# Patient Record
Sex: Male | Born: 1935 | Race: White | Hispanic: No | Smoking: Never smoker
Health system: Southern US, Community
[De-identification: ages and names within clinical notes are randomized; demographics above are authoritative.]

## PROBLEM LIST (undated history)

## (undated) DIAGNOSIS — F0390 Unspecified dementia without behavioral disturbance: Secondary | ICD-10-CM

## (undated) DIAGNOSIS — N390 Urinary tract infection, site not specified: Secondary | ICD-10-CM

## (undated) HISTORY — PX: KNEE ARTHROPLASTY: SHX992

---

## 1997-10-16 ENCOUNTER — Inpatient Hospital Stay (HOSPITAL_COMMUNITY): Admission: EM | Admit: 1997-10-16 | Discharge: 1997-10-18 | Payer: Self-pay | Admitting: *Deleted

## 2016-08-27 ENCOUNTER — Inpatient Hospital Stay (HOSPITAL_BASED_OUTPATIENT_CLINIC_OR_DEPARTMENT_OTHER)
Admission: EM | Admit: 2016-08-27 | Discharge: 2016-09-01 | DRG: 871 | Disposition: A | Discharge status: Skilled Nursing Facility | Payer: Medicare Other | Attending: Family Medicine | Admitting: Family Medicine

## 2016-08-27 ENCOUNTER — Encounter (HOSPITAL_BASED_OUTPATIENT_CLINIC_OR_DEPARTMENT_OTHER): Payer: Self-pay | Admitting: Emergency Medicine

## 2016-08-27 ENCOUNTER — Emergency Department (HOSPITAL_BASED_OUTPATIENT_CLINIC_OR_DEPARTMENT_OTHER): Payer: Medicare Other

## 2016-08-27 DIAGNOSIS — F0391 Unspecified dementia with behavioral disturbance: Secondary | ICD-10-CM

## 2016-08-27 DIAGNOSIS — F0390 Unspecified dementia without behavioral disturbance: Secondary | ICD-10-CM | POA: Diagnosis present

## 2016-08-27 DIAGNOSIS — N390 Urinary tract infection, site not specified: Secondary | ICD-10-CM | POA: Diagnosis present

## 2016-08-27 DIAGNOSIS — E876 Hypokalemia: Secondary | ICD-10-CM | POA: Diagnosis not present

## 2016-08-27 DIAGNOSIS — R5381 Other malaise: Secondary | ICD-10-CM | POA: Diagnosis present

## 2016-08-27 DIAGNOSIS — Z96659 Presence of unspecified artificial knee joint: Secondary | ICD-10-CM | POA: Diagnosis present

## 2016-08-27 DIAGNOSIS — F03918 Unspecified dementia, unspecified severity, with other behavioral disturbance: Secondary | ICD-10-CM

## 2016-08-27 DIAGNOSIS — R531 Weakness: Secondary | ICD-10-CM | POA: Diagnosis not present

## 2016-08-27 DIAGNOSIS — D7589 Other specified diseases of blood and blood-forming organs: Secondary | ICD-10-CM | POA: Diagnosis present

## 2016-08-27 DIAGNOSIS — A419 Sepsis, unspecified organism: Principal | ICD-10-CM | POA: Diagnosis present

## 2016-08-27 DIAGNOSIS — R0902 Hypoxemia: Secondary | ICD-10-CM

## 2016-08-27 DIAGNOSIS — R509 Fever, unspecified: Secondary | ICD-10-CM

## 2016-08-27 DIAGNOSIS — J9601 Acute respiratory failure with hypoxia: Secondary | ICD-10-CM

## 2016-08-27 DIAGNOSIS — R6521 Severe sepsis with septic shock: Secondary | ICD-10-CM | POA: Diagnosis present

## 2016-08-27 DIAGNOSIS — Z79899 Other long term (current) drug therapy: Secondary | ICD-10-CM

## 2016-08-27 DIAGNOSIS — R6510 Systemic inflammatory response syndrome (SIRS) of non-infectious origin without acute organ dysfunction: Secondary | ICD-10-CM

## 2016-08-27 DIAGNOSIS — Z7982 Long term (current) use of aspirin: Secondary | ICD-10-CM

## 2016-08-27 DIAGNOSIS — R001 Bradycardia, unspecified: Secondary | ICD-10-CM | POA: Diagnosis present

## 2016-08-27 DIAGNOSIS — D696 Thrombocytopenia, unspecified: Secondary | ICD-10-CM | POA: Diagnosis present

## 2016-08-27 DIAGNOSIS — N179 Acute kidney failure, unspecified: Secondary | ICD-10-CM

## 2016-08-27 HISTORY — DX: Unspecified dementia, unspecified severity, without behavioral disturbance, psychotic disturbance, mood disturbance, and anxiety: F03.90

## 2016-08-27 HISTORY — DX: Urinary tract infection, site not specified: N39.0

## 2016-08-27 LAB — COMPREHENSIVE METABOLIC PANEL
ALBUMIN: 3.7 g/dL (ref 3.5–5.0)
ALT: 27 U/L (ref 17–63)
AST: 43 U/L — ABNORMAL HIGH (ref 15–41)
Alkaline Phosphatase: 56 U/L (ref 38–126)
Anion gap: 10 (ref 5–15)
BUN: 23 mg/dL — ABNORMAL HIGH (ref 6–20)
CHLORIDE: 106 mmol/L (ref 101–111)
CO2: 26 mmol/L (ref 22–32)
Calcium: 9.6 mg/dL (ref 8.9–10.3)
Creatinine, Ser: 1.38 mg/dL — ABNORMAL HIGH (ref 0.61–1.24)
GFR calc non Af Amer: 46 mL/min — ABNORMAL LOW (ref >60)
GFR, EST AFRICAN AMERICAN: 53 mL/min — AB (ref >60)
GLUCOSE: 128 mg/dL — AB (ref 65–99)
Potassium: 4.2 mmol/L (ref 3.5–5.1)
SODIUM: 142 mmol/L (ref 135–145)
Total Bilirubin: 0.7 mg/dL (ref 0.3–1.2)
Total Protein: 7 g/dL (ref 6.5–8.1)

## 2016-08-27 LAB — CBG MONITORING, ED: Glucose-Capillary: 114 mg/dL — ABNORMAL HIGH (ref 65–99)

## 2016-08-27 LAB — I-STAT CG4 LACTIC ACID, ED: Lactic Acid, Venous: 1.63 mmol/L (ref 0.5–1.9)

## 2016-08-27 MED ORDER — ACETAMINOPHEN 650 MG RE SUPP
650.0000 mg | Freq: Once | RECTAL | Status: AC
  Administered 2016-08-27: 650 mg via RECTAL
  Filled 2016-08-27: qty 1

## 2016-08-27 NOTE — ED Notes (Signed)
CBG is 114

## 2016-08-27 NOTE — ED Provider Notes (Addendum)
[facility]Y [facility] Provider Note: Walter Dell, MD, FACEP  CSN: [id number] MRN: [id number] ARRIVAL: 08/27/16 at 2221 ROOM: MH09/MH09  By signing my name below, I, Walter Williamson, attest that this documentation has been prepared under the direction and in the presence of Walter Libra, MD. Electronically signed, Walter Williamson, ED Scribe. 08/27/16. 11:34 PM.   CHIEF COMPLAINT  Weakness   HISTORY OF PRESENT ILLNESS  Walter Williamson is a 83 y.o. male with h/o UTI, transported via family from a care facility to the Emergency Department with concern for progressive weakness onset today. Daughter informed today of tremors, chills, fatigue and low SPO2 readings this morning. She states she brought the pt to [facility] ED after she was informed that the pt was too weak to support his own weight and he had increased confusion and lethargy. He was noted to be febrile to 102 rectally in the ED.  11:29 PM nurse has started sepsis protocol; pt given 650 mg tylenol rectally for the fever.   LEVEL 5 CAVEAT: HPI and ROS limited due to altered mental status and dementia.   Past Medical History:  Diagnosis Date  . Dementia   . UTI (urinary tract infection)     Past Surgical History:  Procedure Laterality Date  . KNEE ARTHROPLASTY      Family History  Problem Relation Age of Onset  . Dementia Mother     Social History  Substance Use Topics  . Smoking status: Not on file  . Smokeless tobacco: Not on file  . Alcohol use Not on file    Prior to Admission medications   Medication Sig Start Date End Date Taking? Authorizing Provider  ALPRAZolam Prudy Feeler) 0.5 MG tablet Take 0.5 mg by mouth 3 (three) times daily as needed for anxiety or agitation. 07/28/16  Yes Historical Provider, MD  aspirin 81 MG chewable tablet Chew 81 mg by mouth daily.   Yes Historical Provider, MD  divalproex (DEPAKOTE SPRINKLE) 125 MG capsule Take 125 mg by mouth 2 (two) times daily. 08/11/16  Yes Historical Provider, MD    donepezil (ARICEPT) 10 MG tablet Take 20 mg by mouth every morning. 08/23/16  Yes Historical Provider, MD  finasteride (PROSCAR) 5 MG tablet Take 5 mg by mouth every morning. 08/12/16  Yes Historical Provider, MD  memantine (NAMENDA) 10 MG tablet Take 10 mg by mouth 2 (two) times daily. 08/17/16  Yes Historical Provider, MD  metoprolol tartrate (LOPRESSOR) 25 MG tablet Take 12.5 mg by mouth 2 (two) times daily. 08/17/16  Yes Historical Provider, MD  Multiple Vitamins-Minerals (CENTRUM SILVER 50+MEN PO) Take 1 tablet by mouth daily.   Yes Historical Provider, MD  pantoprazole (PROTONIX) 40 MG tablet Take 40 mg by mouth every morning. 07/28/16  Yes Historical Provider, MD  QUEtiapine (SEROQUEL) 25 MG tablet Take 25 mg by mouth at bedtime. 08/19/16  Yes Historical Provider, MD  simvastatin (ZOCOR) 40 MG tablet Take 40 mg by mouth every morning. 08/23/16  Yes Historical Provider, MD    Allergies Patient has no known allergies.   REVIEW OF SYSTEMS     PHYSICAL EXAMINATION  Initial Vital Signs Blood pressure 107/67, pulse 81, temperature (!) 102 F (38.9 C), temperature source Rectal, resp. rate 20, weight 181 lb (82.1 kg), SpO2 (!) 87 %.  Examination General: Well-developed, well-nourished male in no acute distress; appearance consistent with age of record HENT: normocephalic; atraumatic Eyes: pupils equal, round and reactive to light; Neck: supple Heart: regular rate and rhythm; bradychardia Lungs: distant  sounds Abdomen: soft; nondistended; nontender; no masses or hepatosplenomegaly; bowel sounds present Extremities: No deformity; full range of motion; pulses normal Neurologic: Awake, mildly lethargic; motor function intact and symmetric in all extremities; no facial droop Skin: Warm and dry   RESULTS  Summary of this visit's results, reviewed by myself:   EKG Interpretation  Date/Time:  Saturday August 27 2016 22:52:51 EDT Ventricular Rate:  55 PR Interval:    QRS Duration: 161 QT  Interval:  458 QTC Calculation: 439 R Axis:   -27 Text Interpretation:  Sinus bradycardia Left bundle branch block Confirmed by Kasmira Cacioppo  MD, Jonny Ruiz ([id number]) on 08/28/2016 12:14:14 AM      Laboratory Studies: Results for orders placed or performed during the hospital encounter of 08/27/16 (from the past 24 hour(s))  CBG monitoring, ED     Status: Abnormal   Collection Time: 08/27/16 11:12 PM  Result Value Ref Range   Glucose-Capillary 114 (H) 65 - 99 mg/dL  Comprehensive metabolic panel     Status: Abnormal   Collection Time: 08/27/16 11:21 PM  Result Value Ref Range   Sodium 142 135 - 145 mmol/L   Potassium 4.2 3.5 - 5.1 mmol/L   Chloride 106 101 - 111 mmol/L   CO2 26 22 - 32 mmol/L   Glucose, Bld 128 (H) 65 - 99 mg/dL   BUN 23 (H) 6 - 20 mg/dL   Creatinine, Ser 4.78 (H) 0.61 - 1.24 mg/dL   Calcium 9.6 8.9 - 29.5 mg/dL   Total Protein 7.0 6.5 - 8.1 g/dL   Albumin 3.7 3.5 - 5.0 g/dL   AST 43 (H) 15 - 41 U/L   ALT 27 17 - 63 U/L   Alkaline Phosphatase 56 38 - 126 U/L   Total Bilirubin 0.7 0.3 - 1.2 mg/dL   GFR calc non Af Amer 46 (L) >60 mL/min   GFR calc Af Amer 53 (L) >60 mL/min   Anion gap 10 5 - 15  CBC with Differential     Status: Abnormal   Collection Time: 08/27/16 11:21 PM  Result Value Ref Range   WBC 5.7 4.0 - 10.5 K/uL   RBC 4.04 (L) 4.22 - 5.81 MIL/uL   Hemoglobin 13.8 13.0 - 17.0 g/dL   HCT 62.1 30.8 - 65.7 %   MCV 102.5 (H) 78.0 - 100.0 fL   MCH 34.2 (H) 26.0 - 34.0 pg   MCHC 33.3 30.0 - 36.0 g/dL   RDW 84.6 96.2 - 95.2 %   Platelets 128 (L) 150 - 400 K/uL   Neutrophils Relative % 71 %   Lymphocytes Relative 18 %   Monocytes Relative 11 %   Eosinophils Relative 0 %   Basophils Relative 0 %   Neutro Abs 4.1 1.7 - 7.7 K/uL   Lymphs Abs 1.0 0.7 - 4.0 K/uL   Monocytes Absolute 0.6 0.1 - 1.0 K/uL   Eosinophils Absolute 0.0 0.0 - 0.7 K/uL   Basophils Absolute 0.0 0.0 - 0.1 K/uL   RBC Morphology ELLIPTOCYTES    WBC Morphology ATYPICAL LYMPHOCYTES    Smear  Review PLATELET COUNT CONFIRMED BY SMEAR   I-Stat CG4 Lactic Acid, ED     Status: None   Collection Time: 08/27/16 11:35 PM  Result Value Ref Range   Lactic Acid, Venous 1.63 0.5 - 1.9 mmol/L  Urinalysis, Routine w reflex microscopic     Status: Abnormal   Collection Time: 08/28/16 12:47 AM  Result Value Ref Range   Color, Urine YELLOW YELLOW  APPearance CLOUDY (A) CLEAR   Specific Gravity, Urine 1.016 1.005 - 1.030   pH 6.0 5.0 - 8.0   Glucose, UA NEGATIVE NEGATIVE mg/dL   Hgb urine dipstick MODERATE (A) NEGATIVE   Bilirubin Urine NEGATIVE NEGATIVE   Ketones, ur NEGATIVE NEGATIVE mg/dL   Protein, ur NEGATIVE NEGATIVE mg/dL   Nitrite NEGATIVE NEGATIVE   Leukocytes, UA LARGE (A) NEGATIVE  Urinalysis, Microscopic (reflex)     Status: Abnormal   Collection Time: 08/28/16 12:47 AM  Result Value Ref Range   RBC / HPF 0-5 0 - 5 RBC/hpf   WBC, UA 6-30 0 - 5 WBC/hpf   Bacteria, UA MANY (A) NONE SEEN   Squamous Epithelial / LPF 0-5 (A) NONE SEEN  Protime-INR     Status: None   Collection Time: 08/28/16  1:19 AM  Result Value Ref Range   Prothrombin Time 14.8 11.4 - 15.2 seconds   INR 1.15   Influenza panel by PCR (type A & B)     Status: None   Collection Time: 08/28/16  2:20 AM  Result Value Ref Range   Influenza A By PCR NEGATIVE NEGATIVE   Influenza B By PCR NEGATIVE NEGATIVE  I-Stat CG4 Lactic Acid, ED     Status: None   Collection Time: 08/28/16  4:08 AM  Result Value Ref Range   Lactic Acid, Venous 1.01 0.5 - 1.9 mmol/L  MRSA PCR Screening     Status: None   Collection Time: 08/28/16  7:56 AM  Result Value Ref Range   MRSA by PCR NEGATIVE NEGATIVE  CBC     Status: Abnormal   Collection Time: 08/28/16  8:15 AM  Result Value Ref Range   WBC 5.7 4.0 - 10.5 K/uL   RBC 3.93 (L) 4.22 - 5.81 MIL/uL   Hemoglobin 12.7 (L) 13.0 - 17.0 g/dL   HCT 53.6 64.4 - 03.4 %   MCV 105.3 (H) 78.0 - 100.0 fL   MCH 32.3 26.0 - 34.0 pg   MCHC 30.7 30.0 - 36.0 g/dL   RDW 74.2 59.5 - 63.8 %     Platelets 101 (L) 150 - 400 K/uL  Creatinine, serum     Status: None   Collection Time: 08/28/16  8:15 AM  Result Value Ref Range   Creatinine, Ser 1.02 0.61 - 1.24 mg/dL   GFR calc non Af Amer >60 >60 mL/min   GFR calc Af Amer >60 >60 mL/min   Imaging Studies: Dg Chest 2 View  Result Date: 08/28/2016 CLINICAL DATA:  Possible sepsis, fever with hypotension EXAM: CHEST  2 VIEW COMPARISON:  None. FINDINGS: Minimal atelectasis at the left lung base. No focal consolidation or large pleural effusion. Heart size upper normal. Aortic atherosclerosis. No pneumothorax. Mild degenerative changes of the spine. IMPRESSION: Minimal atelectasis at the left lung base.  No focal consolidation. Electronically Signed   By: Jasmine Pang M.D.   On: 08/28/2016 00:22    ED COURSE  Nursing notes and initial vitals signs, including pulse oximetry, reviewed.  Vitals:   08/28/16 0728 08/28/16 1124 08/28/16 1923 08/28/16 1958  BP: 117/62 (!) 122/93 107/60 (!) 96/56  Pulse: (!) 50 64 61 (!) 55  Resp: (!) 21 20 (!) 21 (!) 21  Temp: 97.4 F (36.3 C)   98.5 F (36.9 C)  TempSrc: Oral Oral    SpO2: 95%  (!) 85% 95%  Weight:      Height:       11:58 PM IV fluid bolus initiated  for borderline low blood pressure. Mental status has improved.  1:29 AM Urinalysis suggestive of urinary tract infection. Atypical lymphocytes could represent viral illness, including influenza. We'll test for influenza and have him admitted. We'll start Rocephin for possible UTI.  4:59 AM Blood pressures have continued to run somewhat low although both lactic acids have been within normal limits and the second one showed improvement. Patient awaiting transport to [facility].  PROCEDURES   CRITICAL CARE Performed by: Hanley Seamen Total critical care time: 35 minutes Critical care time was exclusive of separately billable procedures and treating other patients. Critical care was necessary to treat or prevent imminent  or life-threatening deterioration. Critical care was time spent personally by me on the following activities: development of treatment plan with patient and/or surrogate as well as nursing, discussions with consultants, evaluation of patient's response to treatment, examination of patient, obtaining history from patient or surrogate, ordering and performing treatments and interventions, ordering and review of laboratory studies, ordering and review of radiographic studies, pulse oximetry and re-evaluation of patient's condition.   ED DIAGNOSES     ICD-9-CM ICD-10-CM   1. SIRS (systemic inflammatory response syndrome) (HCC) 995.90 R65.10   2. Febrile illness 780.60 R50.9   3. Hypoxia 799.02 R09.02     I personally performed the services described in this documentation, which was scribed in my presence. The recorded information has been reviewed and is accurate.    Walter Libra, MD 08/28/16 0136    Walter Libra, MD 08/28/16 4098    Walter Libra, MD 08/28/16 2243

## 2016-08-27 NOTE — ED Notes (Signed)
[facility])  notified of Code Sepsis per Dr. Read Drivers' verbal orders

## 2016-08-27 NOTE — ED Triage Notes (Signed)
PT presents to ED with complaints of chills and weakness .. daughter states he has seemed weak for about a week now but worse today.  Daughter states he has a hisotry of bad UTI's

## 2016-08-27 NOTE — ED Notes (Signed)
EDP notified of pt meeting sepsis criteria. Protocol orders already in place.

## 2016-08-28 ENCOUNTER — Encounter (HOSPITAL_BASED_OUTPATIENT_CLINIC_OR_DEPARTMENT_OTHER): Payer: Self-pay | Admitting: Emergency Medicine

## 2016-08-28 DIAGNOSIS — N390 Urinary tract infection, site not specified: Secondary | ICD-10-CM | POA: Diagnosis present

## 2016-08-28 DIAGNOSIS — R0902 Hypoxemia: Secondary | ICD-10-CM | POA: Diagnosis not present

## 2016-08-28 DIAGNOSIS — A419 Sepsis, unspecified organism: Secondary | ICD-10-CM | POA: Diagnosis present

## 2016-08-28 DIAGNOSIS — F03918 Unspecified dementia, unspecified severity, with other behavioral disturbance: Secondary | ICD-10-CM

## 2016-08-28 DIAGNOSIS — R6521 Severe sepsis with septic shock: Secondary | ICD-10-CM | POA: Diagnosis present

## 2016-08-28 DIAGNOSIS — Z96659 Presence of unspecified artificial knee joint: Secondary | ICD-10-CM | POA: Diagnosis present

## 2016-08-28 DIAGNOSIS — F0390 Unspecified dementia without behavioral disturbance: Secondary | ICD-10-CM | POA: Diagnosis present

## 2016-08-28 DIAGNOSIS — N3000 Acute cystitis without hematuria: Secondary | ICD-10-CM | POA: Diagnosis not present

## 2016-08-28 DIAGNOSIS — R001 Bradycardia, unspecified: Secondary | ICD-10-CM | POA: Diagnosis present

## 2016-08-28 DIAGNOSIS — Z7982 Long term (current) use of aspirin: Secondary | ICD-10-CM | POA: Diagnosis not present

## 2016-08-28 DIAGNOSIS — J9601 Acute respiratory failure with hypoxia: Secondary | ICD-10-CM | POA: Diagnosis present

## 2016-08-28 DIAGNOSIS — D7589 Other specified diseases of blood and blood-forming organs: Secondary | ICD-10-CM | POA: Diagnosis present

## 2016-08-28 DIAGNOSIS — N179 Acute kidney failure, unspecified: Secondary | ICD-10-CM

## 2016-08-28 DIAGNOSIS — R531 Weakness: Secondary | ICD-10-CM | POA: Diagnosis present

## 2016-08-28 DIAGNOSIS — E876 Hypokalemia: Secondary | ICD-10-CM | POA: Diagnosis not present

## 2016-08-28 DIAGNOSIS — Z79899 Other long term (current) drug therapy: Secondary | ICD-10-CM | POA: Diagnosis not present

## 2016-08-28 DIAGNOSIS — D696 Thrombocytopenia, unspecified: Secondary | ICD-10-CM | POA: Diagnosis present

## 2016-08-28 DIAGNOSIS — R5381 Other malaise: Secondary | ICD-10-CM | POA: Diagnosis present

## 2016-08-28 DIAGNOSIS — F0391 Unspecified dementia with behavioral disturbance: Secondary | ICD-10-CM

## 2016-08-28 LAB — CBC
HCT: 41.4 % (ref 39.0–52.0)
HEMOGLOBIN: 12.7 g/dL — AB (ref 13.0–17.0)
MCH: 32.3 pg (ref 26.0–34.0)
MCHC: 30.7 g/dL (ref 30.0–36.0)
MCV: 105.3 fL — ABNORMAL HIGH (ref 78.0–100.0)
Platelets: 101 10*3/uL — ABNORMAL LOW (ref 150–400)
RBC: 3.93 MIL/uL — AB (ref 4.22–5.81)
RDW: 13.8 % (ref 11.5–15.5)
WBC: 5.7 10*3/uL (ref 4.0–10.5)

## 2016-08-28 LAB — URINALYSIS, ROUTINE W REFLEX MICROSCOPIC
BILIRUBIN URINE: NEGATIVE
Glucose, UA: NEGATIVE mg/dL
Ketones, ur: NEGATIVE mg/dL
NITRITE: NEGATIVE
PROTEIN: NEGATIVE mg/dL
Specific Gravity, Urine: 1.016 (ref 1.005–1.030)
pH: 6 (ref 5.0–8.0)

## 2016-08-28 LAB — PROTIME-INR
INR: 1.15
Prothrombin Time: 14.8 seconds (ref 11.4–15.2)

## 2016-08-28 LAB — CBC WITH DIFFERENTIAL/PLATELET
Basophils Absolute: 0 10*3/uL (ref 0.0–0.1)
Basophils Relative: 0 %
EOS PCT: 0 %
Eosinophils Absolute: 0 10*3/uL (ref 0.0–0.7)
HEMATOCRIT: 41.4 % (ref 39.0–52.0)
Hemoglobin: 13.8 g/dL (ref 13.0–17.0)
Lymphocytes Relative: 18 %
Lymphs Abs: 1 10*3/uL (ref 0.7–4.0)
MCH: 34.2 pg — AB (ref 26.0–34.0)
MCHC: 33.3 g/dL (ref 30.0–36.0)
MCV: 102.5 fL — AB (ref 78.0–100.0)
MONOS PCT: 11 %
Monocytes Absolute: 0.6 10*3/uL (ref 0.1–1.0)
NEUTROS PCT: 71 %
Neutro Abs: 4.1 10*3/uL (ref 1.7–7.7)
Platelets: 128 10*3/uL — ABNORMAL LOW (ref 150–400)
RBC: 4.04 MIL/uL — AB (ref 4.22–5.81)
RDW: 13.4 % (ref 11.5–15.5)
WBC: 5.7 10*3/uL (ref 4.0–10.5)

## 2016-08-28 LAB — INFLUENZA PANEL BY PCR (TYPE A & B)
Influenza A By PCR: NEGATIVE
Influenza B By PCR: NEGATIVE

## 2016-08-28 LAB — CREATININE, SERUM
CREATININE: 1.02 mg/dL (ref 0.61–1.24)
GFR calc Af Amer: >60 mL/min (ref >60)
GFR calc non Af Amer: >60 mL/min (ref >60)

## 2016-08-28 LAB — URINALYSIS, MICROSCOPIC (REFLEX)

## 2016-08-28 LAB — MRSA PCR SCREENING: MRSA BY PCR: NEGATIVE

## 2016-08-28 LAB — I-STAT CG4 LACTIC ACID, ED: LACTIC ACID, VENOUS: 1.01 mmol/L (ref 0.5–1.9)

## 2016-08-28 MED ORDER — FINASTERIDE 5 MG PO TABS
5.0000 mg | ORAL_TABLET | ORAL | Status: DC
  Administered 2016-08-29 – 2016-09-01 (×4): 5 mg via ORAL
  Filled 2016-08-28 (×4): qty 1

## 2016-08-28 MED ORDER — HEPARIN SODIUM (PORCINE) 5000 UNIT/ML IJ SOLN
5000.0000 [IU] | Freq: Three times a day (TID) | INTRAMUSCULAR | Status: DC
  Administered 2016-08-28 (×2): 5000 [IU] via SUBCUTANEOUS
  Filled 2016-08-28 (×2): qty 1

## 2016-08-28 MED ORDER — DONEPEZIL HCL 10 MG PO TABS
20.0000 mg | ORAL_TABLET | ORAL | Status: DC
  Administered 2016-08-29 – 2016-09-01 (×4): 20 mg via ORAL
  Filled 2016-08-28 (×4): qty 2

## 2016-08-28 MED ORDER — DIVALPROEX SODIUM 125 MG PO CSDR
125.0000 mg | DELAYED_RELEASE_CAPSULE | Freq: Two times a day (BID) | ORAL | Status: DC
  Administered 2016-08-28 – 2016-09-01 (×9): 125 mg via ORAL
  Filled 2016-08-28 (×10): qty 1

## 2016-08-28 MED ORDER — SODIUM CHLORIDE 0.9 % IV SOLN
INTRAVENOUS | Status: DC
Start: 2016-08-28 — End: 2016-08-28

## 2016-08-28 MED ORDER — SODIUM CHLORIDE 0.9 % IV SOLN
INTRAVENOUS | Status: DC
  Administered 2016-08-28 – 2016-08-29 (×3): via INTRAVENOUS
  Administered 2016-08-29: 100 mL/h via INTRAVENOUS

## 2016-08-28 MED ORDER — SODIUM CHLORIDE 0.9 % IV BOLUS (SEPSIS)
1000.0000 mL | Freq: Once | INTRAVENOUS | Status: AC
  Administered 2016-08-28: 1000 mL via INTRAVENOUS

## 2016-08-28 MED ORDER — TRAZODONE HCL 50 MG PO TABS
25.0000 mg | ORAL_TABLET | Freq: Every evening | ORAL | Status: DC | PRN
  Administered 2016-08-29 – 2016-08-30 (×2): 25 mg via ORAL
  Filled 2016-08-28 (×2): qty 1

## 2016-08-28 MED ORDER — MEMANTINE HCL 5 MG PO TABS
10.0000 mg | ORAL_TABLET | Freq: Two times a day (BID) | ORAL | Status: DC
  Administered 2016-08-28 – 2016-09-01 (×9): 10 mg via ORAL
  Filled 2016-08-28 (×11): qty 2

## 2016-08-28 MED ORDER — SODIUM CHLORIDE 0.9 % IV BOLUS (SEPSIS): 500.0000 mL | Freq: Once | INTRAVENOUS | Status: DC

## 2016-08-28 MED ORDER — ALPRAZOLAM 0.5 MG PO TABS: 0.5000 mg | ORAL_TABLET | Freq: Three times a day (TID) | ORAL | Status: DC | PRN

## 2016-08-28 MED ORDER — ASPIRIN 81 MG PO CHEW
81.0000 mg | CHEWABLE_TABLET | Freq: Every day | ORAL | Status: DC
  Administered 2016-08-28 – 2016-09-01 (×5): 81 mg via ORAL
  Filled 2016-08-28 (×5): qty 1

## 2016-08-28 MED ORDER — QUETIAPINE FUMARATE 25 MG PO TABS
25.0000 mg | ORAL_TABLET | Freq: Every day | ORAL | Status: DC
  Administered 2016-08-28 – 2016-08-31 (×4): 25 mg via ORAL
  Filled 2016-08-28 (×4): qty 1

## 2016-08-28 MED ORDER — ACETAMINOPHEN 325 MG PO TABS: 650.0000 mg | ORAL_TABLET | Freq: Four times a day (QID) | ORAL | Status: DC | PRN

## 2016-08-28 MED ORDER — BISACODYL 5 MG PO TBEC: 10.0000 mg | DELAYED_RELEASE_TABLET | Freq: Every day | ORAL | Status: DC | PRN

## 2016-08-28 MED ORDER — ORAL CARE MOUTH RINSE
15.0000 mL | Freq: Two times a day (BID) | OROMUCOSAL | Status: DC
  Administered 2016-08-28 – 2016-08-31 (×3): 15 mL via OROMUCOSAL

## 2016-08-28 MED ORDER — SIMVASTATIN 40 MG PO TABS
40.0000 mg | ORAL_TABLET | ORAL | Status: DC
  Administered 2016-08-29 – 2016-09-01 (×4): 40 mg via ORAL
  Filled 2016-08-28 (×4): qty 1

## 2016-08-28 MED ORDER — METOPROLOL TARTRATE 12.5 MG HALF TABLET
12.5000 mg | ORAL_TABLET | Freq: Two times a day (BID) | ORAL | Status: DC
  Administered 2016-08-28 – 2016-08-30 (×5): 12.5 mg via ORAL
  Filled 2016-08-28 (×5): qty 1

## 2016-08-28 MED ORDER — PANTOPRAZOLE SODIUM 40 MG PO TBEC
40.0000 mg | DELAYED_RELEASE_TABLET | ORAL | Status: DC
  Administered 2016-08-29 – 2016-09-01 (×4): 40 mg via ORAL
  Filled 2016-08-28 (×4): qty 1

## 2016-08-28 MED ORDER — ACETAMINOPHEN 650 MG RE SUPP: 650.0000 mg | Freq: Four times a day (QID) | RECTAL | Status: DC | PRN

## 2016-08-28 MED ORDER — DEXTROSE 5 % IV SOLN
1.0000 g | Freq: Once | INTRAVENOUS | Status: AC
  Administered 2016-08-28: 1 g via INTRAVENOUS
  Filled 2016-08-28: qty 10

## 2016-08-28 NOTE — Progress Notes (Addendum)
Pt right wrist iv infiltrated, pt denies any pain, IV taken out. Right arm elevated warm compresses applied. Dr. Adrian Blackwater paged. Will cont to monitor pt.

## 2016-08-28 NOTE — ED Notes (Signed)
Attempted to call report. No answer.

## 2016-08-28 NOTE — Progress Notes (Signed)
Pt's o2 sat went down to 85-90% on Room air. No labored breathing noted. Pt denied any SOB.  Pt placed on 4L of 02 and 02 sats went up to in to the mid 90's. Will cont to monitor pt.

## 2016-08-28 NOTE — Progress Notes (Signed)
Patient arrived from [facility], denies pain.  Heart rate noted in the 40-50s.  Patient asymptomatic.  [facility] pager paged with information in this note.

## 2016-08-28 NOTE — ED Notes (Signed)
IVF stopped due to pt having increased coughing, and development of rales. EDP notified and at bedside.

## 2016-08-28 NOTE — Progress Notes (Signed)
This is a no charge note  Transfer from [facility] per Dr. Read Drivers  83 year old man with past medical history dementia, UTI, who presents with fever, chills, altered mental status for about 1 week. Patient was found to have UTI with large amount of leukocytes on urinalysis. WBC 5.7, lactate 1.63, temperature 102, bradycardia, with saturation 87-93%. chest x-ray negative. Patient received 2 L normal saline bolus, will continue 125 cc/h. His initial blood pressure is 85/50, which improved to 91/85 after normal saline bolus. Patient is accepted to stepdown as inpatient. Patient was started with Rocephin.  Lorretta Harp, MD  [facility] Pager [phone]  If 7PM-7AM, please contact night-coverage [email or web address] Password [facility] 08/28/2016, 3:33 AM

## 2016-08-28 NOTE — ED Notes (Signed)
Attempted report x 1. RN busy, left call back number.  

## 2016-08-28 NOTE — ED Notes (Signed)
Oxygen saturation dropped to 88% on 3L. Oxygen increased to 4L, sats 92%.

## 2016-08-28 NOTE — H&P (Signed)
History and Physical    Walter Williamson WUJ:[id number] DOB: 29-Sep-1931 DOA: 08/27/2016  PCP: Malka So., MD   Patient coming from: the care facility  Chief Complaint: generalized weakness   HPI: Walter Williamson is a 83 y.o. male with medical history significant of dementia, UTI who presented to the ED [facility] with c/o progressive weakness. The patient was ihaving weakness for nearly a week , but yesterday morning he became progressively weak. The facility informed his daughter that the patient was getting weaker and unable to support himself, started having shaking, chills, with increasing oxygen requirements and SpO2 was dropping.  The facility also noted that the patient became confused and more lethargic than usual. The family was concerned and transported the patient to the ED [facility], but eventually he was transferred to the [facility] d/t not step-down bed availability in [facility]  ED Course: On the arrival he was febrile with temperature 102F, bradycardic with HR 42, hypotensive with BP 76/53 and SpO2 86% Blood work demonstrated normal Lactic acid 1.63-->1.01, creatinine 1.38 and thereis no creatinine available for comparison Chest xray showed minimal atelectasis in the LLL EKG showed sinus bradycardia with LBBB UA was suspicious for UTI  Review of Systems: As per HPI otherwise 10 point review of systems negative.   Ambulatory Status: unknown, but seems tht patient is able to ambulate independently  Past Medical History:  Diagnosis Date  . Dementia   . UTI (urinary tract infection)     Past Surgical History:  Procedure Laterality Date  . KNEE ARTHROPLASTY      Social History   Social History  . Marital status: Widowed    Spouse name: N/A  . Number of children: N/A  . Years of education: N/A   Occupational History  . Not on file.   Social History Main Topics  . Smoking status: Not on file  . Smokeless tobacco: Not on file  . Alcohol use Not on file  . Drug use: Unknown  . Sexual  activity: Not on file   Other Topics Concern  . Not on file   Social History Narrative  . No narrative on file   Patient denied using tobacco or alcohol No Known Allergies  Family History  Problem Relation Age of Onset  . Dementia Mother     Prior to Admission medications   Not on File    Physical Exam: Vitals:   08/28/16 0610 08/28/16 0629 08/28/16 0651 08/28/16 0728  BP: (!) 120/56   117/62  Pulse:    (!) 50  Resp:    (!) 21  Temp:  97.8 F (36.6 C)  97.4 F (36.3 C)  TempSrc:  Axillary  Oral  SpO2:  96%  95%  Weight:      Height:    (1.651 m)      General: Appears calm and comfortable Eyes: PERRLA, EOMI, normal lids, iris ENT:  grossly normal hearing, lips & tongue, mucous membranes moist and intact Neck: no lymphoadenopathy, masses or thyromegaly Cardiovascular: RRR, no m/r/g. No JVD, carotid bruits. No LE edema.  Respiratory: bilateral no wheezes, rales, rhonchi or cracles. Normal respiratory effort. No accessory muscle use observed Abdomen: soft, non-tender, non-distended, no organomegaly or masses appreciated. BS present in all quadrants Skin: no rash, ulcers or induration seen on limited exam Musculoskeletal: grossly normal tone BUE/BLE, good ROM, no bony abnormality or joint deformities observed Psychiatric: grossly normal mood and affect, speech fluent and appropriate, alert and oriented x3 Neurologic: CN II-XII grossly intact, moves  all extremities in coordinated fashion, sensation intact  Labs on Admission: I have personally reviewed following labs and imaging studies  CBC, BMP  GFR: Estimated Creatinine Clearance: 34.7 mL/min (A) (by C-G formula based on SCr of 1.38 mg/dL (H)).  Creatinine Clearance: Estimated Creatinine Clearance: 34.7 mL/min (A) (by C-G formula based on SCr of 1.38 mg/dL (H)).  Radiological Exams on Admission: Dg Chest 2 View  Result Date: 08/28/2016 CLINICAL DATA:  Possible sepsis, fever with hypotension EXAM: CHEST  2  VIEW COMPARISON:  None. FINDINGS: Minimal atelectasis at the left lung base. No focal consolidation or large pleural effusion. Heart size upper normal. Aortic atherosclerosis. No pneumothorax. Mild degenerative changes of the spine. IMPRESSION: Minimal atelectasis at the left lung base.  No focal consolidation. Electronically Signed   By: Jasmine Pang M.D.   On: 08/28/2016 00:22    EKG: Independently reviewed - Sinus bradycardia with LBBB  Assessment/Plan Active Problems:   UTI (urinary tract infection)   Severe sepsis with septic shock (CODE) (HCC)   Dementia   AKI (acute kidney injury) (HCC)    Severe Sepsis with septic shock associated with UTI Code sepsis was initiated on arrival and patient received fluid resuscitation with 2.5 L of NS wuth imperovement in blood pressure Lactic acid remains normal Continue IV Rocephin and follow result of the urine culture; adjust antibiotic choice if needed after result of the culture becomes available  AKI -  Creatinine is 1.38 (top normal 1.24) and baseline creatinine is unknown Could possibly be associated with hypo-perfusionand infection Will repeat creatinine after hydration and avcoid nephrotoxic drugs  Dementia - provide supportive care with ADL and feeding, monitor for safety     DVT prophylaxis: heparin Code Status: full Family Communication: none Disposition Plan: stepdown  Consults called: none Admission status: inpatient   Raymon Mutton, [place] Pager: [phone] [facility]  If 7PM-7AM, please contact night-coverage [email or web address] Password [facility]  08/28/2016, 7:45 AM

## 2016-08-28 NOTE — Progress Notes (Signed)
Patient accepted to stepdown bed at [facility] because there are no stepdown beds at [facility].  NS infusion stopped because the patient developed rales. See preceding note from Dr. Clyde Lundborg.

## 2016-08-29 DIAGNOSIS — R0902 Hypoxemia: Secondary | ICD-10-CM

## 2016-08-29 DIAGNOSIS — F0390 Unspecified dementia without behavioral disturbance: Secondary | ICD-10-CM

## 2016-08-29 DIAGNOSIS — R6521 Severe sepsis with septic shock: Secondary | ICD-10-CM

## 2016-08-29 LAB — BASIC METABOLIC PANEL
ANION GAP: 6 (ref 5–15)
BUN: 14 mg/dL (ref 6–20)
CALCIUM: 8.6 mg/dL — AB (ref 8.9–10.3)
CO2: 23 mmol/L (ref 22–32)
CREATININE: 0.82 mg/dL (ref 0.61–1.24)
Chloride: 113 mmol/L — ABNORMAL HIGH (ref 101–111)
Glucose, Bld: 82 mg/dL (ref 65–99)
Potassium: 3.3 mmol/L — ABNORMAL LOW (ref 3.5–5.1)
Sodium: 142 mmol/L (ref 135–145)

## 2016-08-29 LAB — CBC
HCT: 37.9 % — ABNORMAL LOW (ref 39.0–52.0)
HEMOGLOBIN: 11.8 g/dL — AB (ref 13.0–17.0)
MCH: 32.2 pg (ref 26.0–34.0)
MCHC: 31.1 g/dL (ref 30.0–36.0)
MCV: 103.6 fL — ABNORMAL HIGH (ref 78.0–100.0)
PLATELETS: 96 10*3/uL — AB (ref 150–400)
RBC: 3.66 MIL/uL — AB (ref 4.22–5.81)
RDW: 13.5 % (ref 11.5–15.5)
WBC: 5.7 10*3/uL (ref 4.0–10.5)

## 2016-08-29 LAB — URINE CULTURE

## 2016-08-29 MED ORDER — POTASSIUM CHLORIDE CRYS ER 20 MEQ PO TBCR
40.0000 meq | EXTENDED_RELEASE_TABLET | Freq: Once | ORAL | Status: AC
  Administered 2016-08-29: 40 meq via ORAL
  Filled 2016-08-29: qty 2

## 2016-08-29 MED ORDER — DEXTROSE 5 % IV SOLN
1.0000 g | INTRAVENOUS | Status: AC
  Administered 2016-08-29 – 2016-08-31 (×3): 1 g via INTRAVENOUS
  Filled 2016-08-29 (×3): qty 10

## 2016-08-29 NOTE — Evaluation (Signed)
Physical Therapy Evaluation Patient Details Name: Walter Williamson MRN: [id number] DOB: [birth year] Today's Date: 08/29/2016   History of Present Illness  Pt adm with sepsis shock due to UTI. PMH - dementia, tkr,  Clinical Impression  Pt admitted with above diagnosis and presents to PT with functional limitations due to deficits listed below (See PT problem list). Pt needs skilled PT to maximize independence and safety to allow discharge to SNF. Pt currently requiring assist for all mobility. Pt also with O2 dependent. Pt with SpO2 86% on 3L with amb. Incr to 4L with SpO2 >90%.     Follow Up Recommendations SNF    Equipment Recommendations  Other (comment) (to be assessed)    Recommendations for Other Services       Precautions / Restrictions Precautions Precautions: Fall Restrictions Weight Bearing Restrictions: No      Mobility  Bed Mobility Overal bed mobility: Needs Assistance Bed Mobility: Supine to Sit;Sit to Supine     Supine to sit: Min assist Sit to supine: Min assist   General bed mobility comments: Assist to elevate trunk into sitting and to bring feet back up into bed  Transfers Overall transfer level: Needs assistance Equipment used: Rolling walker (2 wheeled) Transfers: Sit to/from Stand Sit to Stand: Min assist         General transfer comment: Assist to bring hips up and for balance  Ambulation/Gait Ambulation/Gait assistance: Min assist Ambulation Distance (Feet): 100 Feet Assistive device: Rolling walker (2 wheeled) Gait Pattern/deviations: Step-through pattern;Decreased step length - right;Decreased step length - left;Shuffle;Trunk flexed Gait velocity: decr Gait velocity interpretation: Below normal speed for age/gender General Gait Details: Assist for balance and verbal cues for correct use of walker  Stairs            Wheelchair Mobility    Modified Rankin (Stroke Patients Only)       Balance Overall balance assessment: Needs  assistance Sitting-balance support: No upper extremity supported;Feet supported Sitting balance-Leahy Scale: Fair     Standing balance support: Single extremity supported Standing balance-Leahy Scale: Poor Standing balance comment: UE assist and min guard for static standing                             Pertinent Vitals/Pain Pain Assessment: Faces Faces Pain Scale: No hurt    Home Living Family/patient expects to be discharged to:: Skilled nursing facility                 Additional Comments: Pt from memory care facility    Prior Function Level of Independence: Needs assistance   Gait / Transfers Assistance Needed: Pt states he amb independently without assistive device (unsure of accuracy)           Hand Dominance        Extremity/Trunk Assessment   Upper Extremity Assessment Upper Extremity Assessment: Defer to OT evaluation    Lower Extremity Assessment Lower Extremity Assessment: Generalized weakness       Communication   Communication: HOH  Cognition Arousal/Alertness: Awake/alert Behavior During Therapy: WFL for tasks assessed/performed Overall Cognitive Status: No family/caregiver present to determine baseline cognitive functioning                                 General Comments: Pt with history of dementia      General Comments      Exercises  Assessment/Plan    PT Assessment Patient needs continued PT services  PT Problem List Decreased strength;Decreased activity tolerance;Decreased balance;Decreased mobility;Decreased knowledge of use of DME;Cardiopulmonary status limiting activity       PT Treatment Interventions DME instruction;Gait training;Functional mobility training;Therapeutic activities;Therapeutic exercise;Balance training;Patient/family education    PT Goals (Current goals can be found in the Care Plan section)  Acute Rehab PT Goals Patient Stated Goal: Pt didn't state PT Goal Formulation:  Patient unable to participate in goal setting Time For Goal Achievement: 09/12/16 Potential to Achieve Goals: Good    Frequency Min 3X/week   Barriers to discharge        Co-evaluation               End of Session Equipment Utilized During Treatment: Gait belt;Oxygen Activity Tolerance: Patient tolerated treatment well Patient left: in bed;with call bell/phone within reach;with bed alarm set   PT Visit Diagnosis: Unsteadiness on feet (R26.81);Muscle weakness (generalized) (M62.81)    Time: 6578-4696 PT Time Calculation (min) (ACUTE ONLY): 28 min   Charges:   PT Evaluation $PT Eval Moderate Complexity: 1 Procedure PT Treatments $Gait Training: 8-22 mins   PT G Codes:        [facility] PT [phone]   Angelina Ok [facility] 08/29/2016, 4:23 PM

## 2016-08-29 NOTE — Progress Notes (Signed)
Lead TEAM 1 - Stepdown/ICU TEAM  Walter Williamson  WUX:[id number] DOB: 04-May-1932 DOA: 08/27/2016 PCP: Malka So., MD    Brief Narrative:  83 y.o. male with history of dementia who presented to the [facility] ED with c/o progressive weakness over a week. His facility noted shaking chills, with increasing oxygen requirements, as well as confusion/lethargy.  On arrival he had a temperature 102F, HR 42, BP 76/53 and SpO2 86%.  Chest xray showed minimal atelectasis in the LLL.  UA was suspicious for UTI.  The pt was transferred to [facility] due to a lack of SDU beds at [facility].    Subjective: The patient is resting comfortably in bed.  He is alert and interactive though mildly confused which may be his baseline.  He denies chest pain shortness breath fevers chills nausea vomiting or abdominal pain.  Assessment & Plan:  Septic shock with UTI Cont empiric abx tx - follow culture data   Hypoxic respiratory failure  Worrisome for possible pneumonia - recheck chest x-ray now the patient hydrated  Macrocytosis Check B12 and folic acid levels  AKI baseline creatinine unknown - crt has normalized w/ volume expansion   Hypokalemia replace - check Mg  Thrombocytopenia Likely related to gram-negative urinary infection - hold heparin products - follow trend  Sinus Bradycardia  HR improving - follow on tele   Dementia   DVT prophylaxis: SCDs Code Status: FULL CODE Family Communication: no family present at time of exam  Disposition Plan: transfer to tele bed   Consultants:  none  Procedures: none  Antimicrobials:  Rocephin 4/21 >  Objective: Blood pressure (!) 123/58, pulse (!) 49, temperature 98.1 F (36.7 C), temperature source Axillary, resp. rate 12, height  (1.651 m), weight 60.5 kg (133 lb 4.8 oz), SpO2 92 %.  Intake/Output Summary (Last 24 hours) at 08/29/16 0814 Last data filed at 08/29/16 0400  Gross per 24 hour  Intake             2355 ml  Output              800 ml  Net              1555 ml   Filed Weights   08/27/16 2314 08/28/16 0500 08/29/16 0407  Weight: 59.4 kg (131 lb) 59.4 kg (131 lb) 60.5 kg (133 lb 4.8 oz)    Examination: General: No acute respiratory distress Lungs: Poor air movement bilateral bases with no wheezing Cardiovascular: Regular rate and rhythm without murmur gallop or rub normal S1 and S2 Abdomen: Nontender, nondistended, soft, bowel sounds positive, no rebound, no ascites, no appreciable mass Extremities: No significant cyanosis, clubbing, or edema bilateral lower extremities  CBC:  Recent Labs Lab 08/27/16 2321 08/28/16 0815 08/29/16 0644  WBC 5.7 5.7 5.7  NEUTROABS 4.1  --   --   HGB 13.8 12.7* 11.8*  HCT 41.4 41.4 37.9*  MCV 102.5* 105.3* 103.6*  PLT 128* 101* 96*   Basic Metabolic Panel:  Recent Labs Lab 08/27/16 2321 08/28/16 0815 08/29/16 0644  NA 142  --  142  K 4.2  --  3.3*  CL 106  --  113*  CO2 26  --  23  GLUCOSE 128*  --  82  BUN 23*  --  14  CREATININE 1.38* 1.02 0.82  CALCIUM 9.6  --  8.6*   GFR: Estimated Creatinine Clearance: 59.4 mL/min (by C-G formula based on SCr of 0.82 mg/dL).  Liver Function Tests:  Recent  Labs Lab 08/27/16 2321  AST 43*  ALT 27  ALKPHOS 56  BILITOT 0.7  PROT 7.0  ALBUMIN 3.7    Coagulation Profile:  Recent Labs Lab 08/28/16 0119  INR 1.15    CBG:  Recent Labs Lab 08/27/16 2312  GLUCAP 114*    Recent Results (from the past 240 hour(s))  MRSA PCR Screening     Status: None   Collection Time: 08/28/16  7:56 AM  Result Value Ref Range Status   MRSA by PCR NEGATIVE NEGATIVE Final    Comment:        The GeneXpert MRSA Assay (FDA approved for NASAL specimens only), is one component of a comprehensive MRSA colonization surveillance program. It is not intended to diagnose MRSA infection nor to guide or monitor treatment for MRSA infections.      Scheduled Meds: . aspirin  81 mg Oral Daily  . divalproex  125 mg Oral BID  . donepezil   20 mg Oral BH-q7a  . finasteride  5 mg Oral BH-q7a  . heparin  5,000 Units Subcutaneous Q8H  . mouth rinse  15 mL Mouth Rinse BID  . memantine  10 mg Oral BID  . metoprolol tartrate  12.5 mg Oral BID  . pantoprazole  40 mg Oral BH-q7a  . QUEtiapine  25 mg Oral QHS  . simvastatin  40 mg Oral BH-q7a   Continuous Infusions: . sodium chloride 100 mL/hr (08/29/16 0532)  . sodium chloride       LOS: 1 day   Lonia Blood, MD [facility] Office  [phone] Pager - Text Page per Amion as per below:  On-Call/Text Page:      [email or web address]      password [email or web address]  If 7PM-7AM, please contact night-coverage [email or web address] Password [email or web address] 08/29/2016, 8:14 AM

## 2016-08-29 NOTE — Progress Notes (Signed)
   Introduced Publishing rights manager.  Chaplain not needed at this time.  Will follow, as needed.  - Rev. Chaplain Kipp Brood MDiv ThM

## 2016-08-30 ENCOUNTER — Inpatient Hospital Stay (HOSPITAL_COMMUNITY): Payer: Medicare Other

## 2016-08-30 DIAGNOSIS — J9601 Acute respiratory failure with hypoxia: Secondary | ICD-10-CM

## 2016-08-30 LAB — CBC
HEMATOCRIT: 38.8 % — AB (ref 39.0–52.0)
HEMOGLOBIN: 12.2 g/dL — AB (ref 13.0–17.0)
MCH: 32 pg (ref 26.0–34.0)
MCHC: 31.4 g/dL (ref 30.0–36.0)
MCV: 101.8 fL — AB (ref 78.0–100.0)
Platelets: 120 10*3/uL — ABNORMAL LOW (ref 150–400)
RBC: 3.81 MIL/uL — AB (ref 4.22–5.81)
RDW: 13.2 % (ref 11.5–15.5)
WBC: 7.1 10*3/uL (ref 4.0–10.5)

## 2016-08-30 LAB — IRON AND TIBC
IRON: 41 ug/dL — AB (ref 45–182)
SATURATION RATIOS: 23 % (ref 17.9–39.5)
TIBC: 176 ug/dL — AB (ref 250–450)
UIBC: 135 ug/dL

## 2016-08-30 LAB — FERRITIN: Ferritin: 189 ng/mL (ref 24–336)

## 2016-08-30 LAB — COMPREHENSIVE METABOLIC PANEL
ALK PHOS: 52 U/L (ref 38–126)
ALT: 23 U/L (ref 17–63)
AST: 35 U/L (ref 15–41)
Albumin: 2.7 g/dL — ABNORMAL LOW (ref 3.5–5.0)
Anion gap: 7 (ref 5–15)
BILIRUBIN TOTAL: 0.5 mg/dL (ref 0.3–1.2)
BUN: 14 mg/dL (ref 6–20)
CALCIUM: 8.7 mg/dL — AB (ref 8.9–10.3)
CO2: 26 mmol/L (ref 22–32)
CREATININE: 0.79 mg/dL (ref 0.61–1.24)
Chloride: 108 mmol/L (ref 101–111)
GFR calc non Af Amer: >60 mL/min (ref >60)
Glucose, Bld: 90 mg/dL (ref 65–99)
Potassium: 3.5 mmol/L (ref 3.5–5.1)
SODIUM: 141 mmol/L (ref 135–145)
TOTAL PROTEIN: 5.3 g/dL — AB (ref 6.5–8.1)

## 2016-08-30 LAB — RETICULOCYTES
RBC.: 3.81 MIL/uL — ABNORMAL LOW (ref 4.22–5.81)
Retic Count, Absolute: 22.9 10*3/uL (ref 19.0–186.0)
Retic Ct Pct: 0.6 % (ref 0.4–3.1)

## 2016-08-30 LAB — VITAMIN B12: Vitamin B-12: 789 pg/mL (ref 180–914)

## 2016-08-30 LAB — PROCALCITONIN: Procalcitonin: <0.1 ng/mL

## 2016-08-30 LAB — MAGNESIUM: Magnesium: 1.9 mg/dL (ref 1.7–2.4)

## 2016-08-30 LAB — FOLATE: Folate: 18.4 ng/mL (ref >5.9)

## 2016-08-30 NOTE — Evaluation (Signed)
Clinical/Bedside Swallow Evaluation Patient Details  Name: Walter Williamson MRN: [id number] Date of Birth: [birth year]  Today's Date: 08/30/2016 Time: SLP Start Time (ACUTE ONLY): 1640 SLP Stop Time (ACUTE ONLY): 1658 SLP Time Calculation (min) (ACUTE ONLY): 18 min  Past Medical History:  Past Medical History:  Diagnosis Date  . Dementia   . UTI (urinary tract infection)    Past Surgical History:  Past Surgical History:  Procedure Laterality Date  . KNEE ARTHROPLASTY     HPI:  83 y.o.malewith history of dementia who presented to the [facility] ED with c/o progressive weakness over a week. His facility noted shaking chills, with increasing oxygen requirements, as well as confusion/lethargy. On arrival he had a temperature 102F, HR 42, BP 76/53 and SpO2 86%. Chest xray showed minimal atelectasis in the LLL. UA was suspicious for UTI.    Assessment / Plan / Recommendation Clinical Impression  Pt presents with a mild oropharyngeal dysphagia which is impacted by his advanced dementia and cognitive decline. Nursing reports intermitent expectoration of solid PO at breakfast meal earlier this date as well as reduced PO meal consumption. Pt with edentulous oral cavity resulting in prolonged mastication of solid PO and reduced bolus cohesion. No family member present to determine if pt has dentures at previous facility. No overt s/sx of aspiration with any consistencies. Recommend diet downgrade to dysphagia 2 (chopped) and thin liquids to enhance swallow safety and maximize nutrition and hydration. Full supervision and feeding assistance warranted at all meals, recommend medicines crushed in puree. ST to follow up for diet tolerance.  SLP Visit Diagnosis: Dysphagia, oropharyngeal phase (R13.12)    Aspiration Risk  Mild aspiration risk    Diet Recommendation   Dysphagia 2 (chopped) thin liquids   Medication Administration: Crushed with puree    Other  Recommendations Oral Care Recommendations: Oral  care BID   Follow up Recommendations Skilled Nursing facility      Frequency and Duration min 2x/week          Prognosis Prognosis for Safe Diet Advancement: Good Barriers to Reach Goals: Cognitive deficits      Swallow Study   General Date of Onset: 08/27/16 HPI: 83 y.o.malewith history of dementia who presented to the [facility] ED with c/o progressive weakness over a week. His facility noted shaking chills, with increasing oxygen requirements, as well as confusion/lethargy. On arrival he had a temperature 102F, HR 42, BP 76/53 and SpO2 86%. Chest xray showed minimal atelectasis in the LLL. UA was suspicious for UTI.  Type of Study: Bedside Swallow Evaluation Previous Swallow Assessment: none on file Diet Prior to this Study: Regular;Thin liquids Temperature Spikes Noted: No Respiratory Status: Nasal cannula History of Recent Intubation: No Behavior/Cognition: Alert;Cooperative;Confused Oral Cavity Assessment: Within Functional Limits Oral Cavity - Dentition: Edentulous Self-Feeding Abilities: Needs assist Patient Positioning: Upright in bed Baseline Vocal Quality: Low vocal intensity Volitional Cough: Strong Volitional Swallow: Able to elicit    Oral/Motor/Sensory Function Overall Oral Motor/Sensory Function: Generalized oral weakness   Ice Chips Ice chips: Not tested   Thin Liquid Thin Liquid: Within functional limits    Nectar Thick Nectar Thick Liquid: Not tested   Honey Thick Honey Thick Liquid: Not tested   Puree Puree: Within functional limits   Solid   GO   Solid: Impaired Oral Phase Impairments: Impaired mastication;Reduced lingual movement/coordination Oral Phase Functional Implications: Prolonged oral transit;Impaired mastication Pharyngeal Phase Impairments: Suspected delayed Swallow;Multiple swallows       Marcene Duos MA, CCC-SLP Acute Care Speech Language  Pathologist    Kennieth Rad 08/30/2016,5:05 PM

## 2016-08-30 NOTE — Progress Notes (Signed)
[place] TEAM 1 - Stepdown/ICU TEAM  Walter Williamson  [id number]:[id number] DOB: 07-24-1931 DOA: 08/27/2016 PCP: Malka So., MD    Brief Narrative:  83 y.o. male with history of dementia who presented to the [facility] ED with c/o progressive weakness over a week. His facility noted shaking chills, with increasing oxygen requirements, as well as confusion/lethargy.  On arrival he had a temperature 102F, HR 42, BP 76/53 and SpO2 86%.  Chest xray showed minimal atelectasis in the LLL.  UA was suspicious for UTI.  The pt was transferred to [facility] due to a lack of SDU beds at [facility].    Subjective:   He denies chest pain shortness breath fevers chills nausea vomiting or abdominal pain.  Assessment & Plan:  Septic shock with pneumonia and doubt UTI  -urine culture nonspecific shows multiple morphologies UA unimpressive Blood culture no growth so far, influenza panel negative Check pro-calcitonin Monitor off of antibiotics  Hypoxic respiratory failure /possible aspiration Repeat chest x-ray negative  Macrocytosis   B12 789  and folic acid levels  AKI baseline creatinine unknown - crt has normalized w/ volume expansion   Hypokalemia  Mg 1.9  Thrombocytopenia Likely related to gram-negative urinary infection - hold heparin products - follow trend  Sinus Bradycardia  HR improving - follow on tele   Dementia   DVT prophylaxis: SCDs Code Status: FULL CODE Family Communication: no family present at time of exam  Disposition Plan: Continue tele bed , will need SNF  Consultants:  none  Procedures: none  Antimicrobials:  Rocephin 4/21 >4/24  Objective: Blood pressure 128/73, pulse 76, temperature 98 F (36.7 C), temperature source Oral, resp. rate 18, height  (1.651 m), weight 60.5 kg (133 lb 4.8 oz), SpO2 94 %.  Intake/Output Summary (Last 24 hours) at 08/30/16 1304 Last data filed at 08/30/16 0500  Gross per 24 hour  Intake          2020.33 ml  Output             1450 ml  Net            570.33 ml   Filed Weights   08/27/16 2314 08/28/16 0500 08/29/16 0407  Weight: 59.4 kg (131 lb) 59.4 kg (131 lb) 60.5 kg (133 lb 4.8 oz)    Examination: General: No acute respiratory distress Lungs: Poor air movement bilateral bases with no wheezing Cardiovascular: Regular rate and rhythm without murmur gallop or rub normal S1 and S2 Abdomen: Nontender, nondistended, soft, bowel sounds positive, no rebound, no ascites, no appreciable mass Extremities: No significant cyanosis, clubbing, or edema bilateral lower extremities  CBC:  Recent Labs Lab 08/27/16 2321 08/28/16 0815 08/29/16 0644 08/30/16 0250  WBC 5.7 5.7 5.7 7.1  NEUTROABS 4.1  --   --   --   HGB 13.8 12.7* 11.8* 12.2*  HCT 41.4 41.4 37.9* 38.8*  MCV 102.5* 105.3* 103.6* 101.8*  PLT 128* 101* 96* 120*   Basic Metabolic Panel:  Recent Labs Lab 08/27/16 2321 08/28/16 0815 08/29/16 0644 08/30/16 0250  NA 142  --  142 141  K 4.2  --  3.3* 3.5  CL 106  --  113* 108  CO2 26  --  23 26  GLUCOSE 128*  --  82 90  BUN 23*  --  14 14  CREATININE 1.38* 1.02 0.82 0.79  CALCIUM 9.6  --  8.6* 8.7*  MG  --   --   --  1.9   GFR: Estimated  Creatinine Clearance: 60.9 mL/min (by C-G formula based on SCr of 0.79 mg/dL).  Liver Function Tests:  Recent Labs Lab 08/27/16 2321 08/30/16 0250  AST 43* 35  ALT 27 23  ALKPHOS 56 52  BILITOT 0.7 0.5  PROT 7.0 5.3*  ALBUMIN 3.7 2.7*    Coagulation Profile:  Recent Labs Lab 08/28/16 0119  INR 1.15    CBG:  Recent Labs Lab 08/27/16 2312  GLUCAP 114*    Recent Results (from the past 240 hour(s))  Culture, blood (Routine x 2)     Status: None (Preliminary result)   Collection Time: 08/27/16 11:20 PM  Result Value Ref Range Status   Specimen Description BLOOD RIGHT FOREARM  Final   Special Requests   Final    BOTTLES DRAWN AEROBIC AND ANAEROBIC Blood Culture adequate volume   Culture   Final    NO GROWTH 2 DAYS Performed at [facility],  [address] [place], [place] [address]    Report Status PENDING  Incomplete  Culture, blood (Routine x 2)     Status: None (Preliminary result)   Collection Time: 08/27/16 11:25 PM  Result Value Ref Range Status   Specimen Description BLOOD LEFT ARM  Final   Special Requests   Final    BOTTLES DRAWN AEROBIC AND ANAEROBIC Blood Culture adequate volume   Culture   Final    NO GROWTH 2 DAYS Performed at [facility], [address] [place], [place] [address]    Report Status PENDING  Incomplete  Urine culture     Status: Abnormal   Collection Time: 08/28/16 12:47 AM  Result Value Ref Range Status   Specimen Description URINE, CATHETERIZED  Final   Special Requests NONE  Final   Culture MULTIPLE SPECIES PRESENT, SUGGEST RECOLLECTION (A)  Final   Report Status 08/29/2016 FINAL  Final  MRSA PCR Screening     Status: None   Collection Time: 08/28/16  7:56 AM  Result Value Ref Range Status   MRSA by PCR NEGATIVE NEGATIVE Final    Comment:        The GeneXpert MRSA Assay (FDA approved for NASAL specimens only), is one component of a comprehensive MRSA colonization surveillance program. It is not intended to diagnose MRSA infection nor to guide or monitor treatment for MRSA infections.      Scheduled Meds: . aspirin  81 mg Oral Daily  . divalproex  125 mg Oral BID  . donepezil  20 mg Oral BH-q7a  . finasteride  5 mg Oral BH-q7a  . mouth rinse  15 mL Mouth Rinse BID  . memantine  10 mg Oral BID  . metoprolol tartrate  12.5 mg Oral BID  . pantoprazole  40 mg Oral BH-q7a  . QUEtiapine  25 mg Oral QHS  . simvastatin  40 mg Oral BH-q7a   Continuous Infusions: . sodium chloride 60 mL/hr at 08/29/16 1754  . cefTRIAXone (ROCEPHIN)  IV Stopped (08/30/16 0930)     LOS: 2 days    On-Call/Text Page:      [email or web address]      password [email or web address]  If 7PM-7AM, please contact night-coverage [email or web address] Password [email or web address] 08/30/2016, 1:04 PM

## 2016-08-30 NOTE — Care Management Note (Addendum)
Case Management Note  Patient Details  Name: Arkel Cartwright MRN: [id number] Date of Birth: 10/02/1931  Subjective/Objective:  Pt presented for progressive weakness- positive UTI continues on IV Rocephin. Pt is from [facility]. PT/OT consulted and recommendations for SNF. Daughter not at bedside at time of conversation.                 Action/Plan: CSW is aware and CM will continue to monitor for additional needs.   Expected Discharge Date:                  Expected Discharge Plan:  Skilled Nursing Facility  In-House Referral:  Clinical Social Work  Discharge planning Services  CM Consult  Post Acute Care Choice:   N/A Choice offered to:   N/A  DME Arranged:   N/A DME Agency:   N/A  HH Arranged:   N/A HH Agency:   N/A  Status of Service: COMPLETED.  If discussed at Long Length of Stay Meetings, dates discussed:   09-01-16 Additional Comments: 1353 09-01-16 Tomi Bamberger, RN,BSN [phone] Family wants [facility] SN[facility] once stable for d/c.   Gala Lewandowsky, RN 08/30/2016, 2:50 PM

## 2016-08-30 NOTE — Progress Notes (Signed)
Physical Therapy Treatment Patient Details Name: Walter Williamson MRN: [id number] DOB: 06-27-1931 Today's Date: 08/30/2016    History of Present Illness Pt adm with sepsis shock due to UTI. PMH - dementia, tkr,    PT Comments    Pt continues to require assist for all mobility. Continue to feel pt will need SNF at dc.  Follow Up Recommendations  SNF     Equipment Recommendations  Other (comment) (to be assessed)    Recommendations for Other Services       Precautions / Restrictions Precautions Precautions: Fall Restrictions Weight Bearing Restrictions: No    Mobility  Bed Mobility Overal bed mobility: Needs Assistance Bed Mobility: Supine to Sit;Sit to Supine     Supine to sit: Min assist Sit to supine: Min assist   General bed mobility comments: Assist to elevate trunk into sitting and to bring feet back up into bed  Transfers Overall transfer level: Needs assistance Equipment used: Rolling walker (2 wheeled) Transfers: Sit to/from Stand Sit to Stand: Mod assist         General transfer comment: Assist to bring hips up and for balance. Pt with posterior and lt lean.  Ambulation/Gait Ambulation/Gait assistance: Min assist;Mod assist Ambulation Distance (Feet): 150 Feet Assistive device: Rolling walker (2 wheeled) Gait Pattern/deviations: Step-through pattern;Decreased step length - right;Decreased step length - left;Shuffle;Trunk flexed Gait velocity: decr Gait velocity interpretation: Below normal speed for age/gender General Gait Details: Assist for balance and support. Pt with initial heavy rt lean that improved with incr distance. SpO2 90% on 2L at rest. With amb decr to 85% on 2L so incr O2 to 4L with SpO2 returning to above 90.   Stairs            Wheelchair Mobility    Modified Rankin (Stroke Patients Only)       Balance Overall balance assessment: Needs assistance Sitting-balance support: No upper extremity supported;Feet supported Sitting  balance-Leahy Scale: Fair     Standing balance support: Single extremity supported Standing balance-Leahy Scale: Poor Standing balance comment: UE assist and mod assist for static standing                            Cognition Arousal/Alertness: Awake/alert Behavior During Therapy: WFL for tasks assessed/performed Overall Cognitive Status: No family/caregiver present to determine baseline cognitive functioning                                 General Comments: Pt with history of dementia      Exercises      General Comments        Pertinent Vitals/Pain Pain Assessment: Faces Faces Pain Scale: No hurt    Home Living                      Prior Function            PT Goals (current goals can now be found in the care plan section) Progress towards PT goals: Progressing toward goals    Frequency    Min 3X/week      PT Plan Current plan remains appropriate    Co-evaluation             End of Session Equipment Utilized During Treatment: Gait belt;Oxygen Activity Tolerance: Patient tolerated treatment well Patient left: in bed;with call bell/phone within reach;with bed alarm set;Other (comment) (mitts reapplied)  Nurse Communication: Other (comment);Mobility status (Pt had pulled condom cath off prior to my arrival) PT Visit Diagnosis: Unsteadiness on feet (R26.81);Muscle weakness (generalized) (M62.81)     Time: 1610-9604 PT Time Calculation (min) (ACUTE ONLY): 23 min  Charges:  $Gait Training: 23-37 mins                    G Codes:       [facility] PT [phone]    Angelina Ok [facility] 08/30/2016, 2:01 PM

## 2016-08-31 ENCOUNTER — Encounter (HOSPITAL_COMMUNITY): Payer: Self-pay | Admitting: *Deleted

## 2016-08-31 DIAGNOSIS — J9601 Acute respiratory failure with hypoxia: Secondary | ICD-10-CM

## 2016-08-31 LAB — COMPREHENSIVE METABOLIC PANEL
ALBUMIN: 2.4 g/dL — AB (ref 3.5–5.0)
ALT: 19 U/L (ref 17–63)
ANION GAP: 5 (ref 5–15)
AST: 27 U/L (ref 15–41)
Alkaline Phosphatase: 45 U/L (ref 38–126)
BILIRUBIN TOTAL: 0.9 mg/dL (ref 0.3–1.2)
BUN: 9 mg/dL (ref 6–20)
CALCIUM: 8.3 mg/dL — AB (ref 8.9–10.3)
CO2: 24 mmol/L (ref 22–32)
Chloride: 109 mmol/L (ref 101–111)
Creatinine, Ser: 0.86 mg/dL (ref 0.61–1.24)
GLUCOSE: 92 mg/dL (ref 65–99)
POTASSIUM: 3.1 mmol/L — AB (ref 3.5–5.1)
Sodium: 138 mmol/L (ref 135–145)
TOTAL PROTEIN: 4.9 g/dL — AB (ref 6.5–8.1)

## 2016-08-31 LAB — CBC
HEMATOCRIT: 34.4 % — AB (ref 39.0–52.0)
Hemoglobin: 11.2 g/dL — ABNORMAL LOW (ref 13.0–17.0)
MCH: 32.8 pg (ref 26.0–34.0)
MCHC: 32.6 g/dL (ref 30.0–36.0)
MCV: 100.9 fL — ABNORMAL HIGH (ref 78.0–100.0)
Platelets: 104 10*3/uL — ABNORMAL LOW (ref 150–400)
RBC: 3.41 MIL/uL — ABNORMAL LOW (ref 4.22–5.81)
RDW: 13.4 % (ref 11.5–15.5)
WBC: 6.4 10*3/uL (ref 4.0–10.5)

## 2016-08-31 MED ORDER — POTASSIUM CHLORIDE CRYS ER 20 MEQ PO TBCR
40.0000 meq | EXTENDED_RELEASE_TABLET | Freq: Two times a day (BID) | ORAL | Status: AC
  Administered 2016-08-31 – 2016-09-01 (×3): 40 meq via ORAL
  Filled 2016-08-31 (×3): qty 2

## 2016-08-31 MED ORDER — CEFUROXIME AXETIL 500 MG PO TABS
500.0000 mg | ORAL_TABLET | Freq: Two times a day (BID) | ORAL | Status: DC
  Administered 2016-09-01 (×2): 500 mg via ORAL
  Filled 2016-08-31 (×2): qty 1

## 2016-08-31 NOTE — Clinical Social Work Note (Signed)
Clinical Social Work Assessment  Patient Details  Name: Walter Williamson MRN: [id number] Date of Birth: 1931-07-11  Date of referral:  08/31/16               Reason for consult:  Facility Placement                Permission sought to share information with:  Facility Medical sales representative, Family Supports Permission granted to share information::     Name::        Agency::  [facility]  Relationship::  daughter Valetta Mole (h) [phone] (c) [phone] (no voicemail on cell, if no answer leave voicemail on home phone)  Contact Information:     Housing/Transportation Living arrangements for the past 2 months:  Assisted Living Facility Source of Information:  Adult Children, Facility Patient Interpreter Needed:  None Criminal Activity/Legal Involvement Pertinent to Current Situation/Hospitalization:  No - Comment as needed Significant Relationships:  Adult Children, Community Support Lives with:  Facility Resident Do you feel safe going back to the place where you live?  Yes Need for family participation in patient care:  Yes (Comment) (daughter POA- pt has dementia and family makes care decisions)  Care giving concerns:  Pt from [facility], memory care unit. Has lived there for over 1 year and at baseline ambulated independently while getting minimal assistance with ADLs per facility representative Dondra Spry.  Per daughter Alvis Lemmings, family understands PT recommendations for SNF and that pt is not at his baseline. Family states they are agreeable to pursuing SNF. They note they have begun to consider eventual LT SNF placement and are working with attorney to understand pt's finances. Daughter emphasizes as this time they are interested in ST SNF only. Pt has dementia and daughter reports general good health throughout lifetime.    Social Worker assessment / plan:  CSW consulted for potential SNF placement. Spoke with [facility] staff to  understand pt's baseline and hx there.  Also spoke with pt's daughter Alvis Lemmings. Explained role and reason for consult.  Daughter reports family agreeable to SNF placement. Have interest in [place].  Completed PASSR and FL2, submitted referrals to area SNFs.  Will follow up with daughter re: bed offers.   Employment status:  Retired Health and safety inspector:  Medicare PT Recommendations:  Skilled Nursing Facility Information / Referral to community resources:  Skilled Nursing Facility  Patient/Family's Response to care:  Express appreciation for care.   Patient/Family's Understanding of and Emotional Response to Diagnosis, Current Treatment, and Prognosis:  Pt minimally oriented due to dementia, unable to express understanding of plan. Daughter understanding and engaged, reports "I'm anxious about this because it took [place] a long time to decide on the ALF and we know we might not have as much time to decide on a SNF. I want the best care for him."  Emotional Assessment Appearance:  Appears stated age Attitude/Demeanor/Rapport:   (appropriate to situation) Affect (typically observed):  Calm Orientation:  Oriented to Self, Oriented to Place Alcohol / Substance use:  Not Applicable Psych involvement (Current and /or in the community):  No (Comment)  Discharge Needs  Concerns to be addressed:  No discharge needs identified Readmission within the last 30 days:  No Current discharge risk:  Dependent with Mobility Barriers to Discharge:  Continued Medical Work up   [employer], LCSW 08/31/2016, 1:35 PM

## 2016-08-31 NOTE — Progress Notes (Addendum)
[place] TEAM 1 - Stepdown/ICU TEAM  Walter Williamson  WUJ:[id number] DOB: January 04, 1932 DOA: 08/27/2016 PCP: Malka So., MD    Brief Narrative:  83 y.o. male with history of dementia who presented to the [facility] ED with c/o progressive weakness over a week. His facility noted shaking chills, with increasing oxygen requirements, as well as confusion/lethargy.  On arrival he had a temperature 102F, HR 42, BP 76/53 and SpO2 86%.  Chest xray showed minimal atelectasis in the LLL.  UA was suspicious for UTI.  The pt was transferred to [facility] due to a lack of SDU beds at [facility].    Subjective: Pt denies cp, sob, n/v, or abdom pain.  He is resting comfortably in the bed at the time of my visit.    Assessment & Plan:  Septic shock with UTI UA is definitely abnormal w/ large leukocytes and 6-30 WBC - urine culture did not prove helpful as multiple species were noted - plan to complete 7 day course of abx tx   Hypoxic respiratory failure  Worrisome for possible pneumonia but f/u chest x-ray after pt well hydrated still not indicative of an infiltrate and w/ normal procalcitonin strongly argues against PNA  Macrocytosis B12 and folic acid levels normal  AKI baseline creatinine unknown - crt has normalized w/ volume expansion   Hypokalemia replace further and follow - Mg is normal   Thrombocytopenia Likely related to gram-negative urinary infection - holding heparin products - stable w/ no evidence of bleeding   Sinus Bradycardia  HR stable - BP preserved - likely due to BB +/- aricept - stop BB - cont aricept   Dementia Stable mental status - no agitation   DVT prophylaxis: SCDs Code Status: FULL CODE Family Communication: no family present at time of exam  Disposition Plan: ready for SNF - possible bed available 4/26  Consultants:  none  Procedures: none  Antimicrobials:  Rocephin 4/21 > 4/25 Ceftin 4/26 >  Objective: Blood pressure 121/62, pulse (!) 59, temperature 98.4 F (36.9 C),  temperature source Oral, resp. rate 19, height  (1.651 m), weight 63.5 kg (139 lb 14.4 oz), SpO2 96 %.  Intake/Output Summary (Last 24 hours) at 08/31/16 1102 Last data filed at 08/31/16 0907  Gross per 24 hour  Intake             1721 ml  Output              750 ml  Net              971 ml   Filed Weights   08/28/16 0500 08/29/16 0407 08/31/16 0300  Weight: 59.4 kg (131 lb) 60.5 kg (133 lb 4.8 oz) 63.5 kg (139 lb 14.4 oz)    Examination: General: No acute respiratory distress - alert  Lungs: CTA th/o w/ no wheezing  Cardiovascular: bradycardic - no M, G, R Abdomen: Nontender, nondistended, soft, bowel sounds positive, no rebound, no ascites Extremities: No significant edema B LE   CBC:  Recent Labs Lab 08/27/16 2321 08/28/16 0815 08/29/16 0644 08/30/16 0250 08/31/16 0719  WBC 5.7 5.7 5.7 7.1 6.4  NEUTROABS 4.1  --   --   --   --   HGB 13.8 12.7* 11.8* 12.2* 11.2*  HCT 41.4 41.4 37.9* 38.8* 34.4*  MCV 102.5* 105.3* 103.6* 101.8* 100.9*  PLT 128* 101* 96* 120* 104*   Basic Metabolic Panel:  Recent Labs Lab 08/27/16 2321 08/28/16 0815 08/29/16 9562 08/30/16 0250 08/31/16 0719  NA 142  --  142 141 138  K 4.2  --  3.3* 3.5 3.1*  CL 106  --  113* 108 109  CO2 26  --  GLUCOSE 128*  --  82 90 92  BUN 23*  --  CREATININE 1.38* 1.02 0.82 0.79 0.86  CALCIUM 9.6  --  8.6* 8.7* 8.3*  MG  --   --   --  1.9  --    GFR: Estimated Creatinine Clearance: 57.6 mL/min (by C-G formula based on SCr of 0.86 mg/dL).  Liver Function Tests:  Recent Labs Lab 08/27/16 2321 08/30/16 0250 08/31/16 0719  AST 43* 35 27  ALT ALKPHOS 56 52 45  BILITOT 0.7 0.5 0.9  PROT 7.0 5.3* 4.9*  ALBUMIN 3.7 2.7* 2.4*    Coagulation Profile:  Recent Labs Lab 08/28/16 0119  INR 1.15    CBG:  Recent Labs Lab 08/27/16 2312  GLUCAP 114*    Recent Results (from the past 240 hour(s))  Culture, blood (Routine x 2)     Status: None (Preliminary  result)   Collection Time: 08/27/16 11:20 PM  Result Value Ref Range Status   Specimen Description BLOOD RIGHT FOREARM  Final   Special Requests   Final    BOTTLES DRAWN AEROBIC AND ANAEROBIC Blood Culture adequate volume   Culture   Final    NO GROWTH 2 DAYS Performed at [facility], [address], [place], [place] [address]    Report Status PENDING  Incomplete  Culture, blood (Routine x 2)     Status: None (Preliminary result)   Collection Time: 08/27/16 11:25 PM  Result Value Ref Range Status   Specimen Description BLOOD LEFT ARM  Final   Special Requests   Final    BOTTLES DRAWN AEROBIC AND ANAEROBIC Blood Culture adequate volume   Culture   Final    NO GROWTH 2 DAYS Performed at [facility], [address] [place], [place] [address]    Report Status PENDING  Incomplete  Urine culture     Status: Abnormal   Collection Time: 08/28/16 12:47 AM  Result Value Ref Range Status   Specimen Description URINE, CATHETERIZED  Final   Special Requests NONE  Final   Culture MULTIPLE SPECIES PRESENT, SUGGEST RECOLLECTION (A)  Final   Report Status 08/29/2016 FINAL  Final  MRSA PCR Screening     Status: None   Collection Time: 08/28/16  7:56 AM  Result Value Ref Range Status   MRSA by PCR NEGATIVE NEGATIVE Final    Comment:        The GeneXpert MRSA Assay (FDA approved for NASAL specimens only), is one component of a comprehensive MRSA colonization surveillance program. It is not intended to diagnose MRSA infection nor to guide or monitor treatment for MRSA infections.      Scheduled Meds: . aspirin  81 mg Oral Daily  . divalproex  125 mg Oral BID  . donepezil  20 mg Oral BH-q7a  . finasteride  5 mg Oral BH-q7a  . mouth rinse  15 mL Mouth Rinse BID  . memantine  10 mg Oral BID  . metoprolol tartrate  12.5 mg Oral BID  . pantoprazole  40 mg Oral BH-q7a  . QUEtiapine  25 mg Oral QHS  . simvastatin  40 mg Oral BH-q7a   Continuous Infusions: . sodium  chloride 60 mL/hr at 08/30/16 1900  .  cefTRIAXone (ROCEPHIN)  IV Stopped (08/31/16 0912)     LOS: 3 days   Lonia Blood, MD [facility] Office  [phone] Pager - Text Page per Amion as per below:  On-Call/Text Page:      [email or web address]      password [email or web address]  If 7PM-7AM, please contact night-coverage [email or web address] Password [email or web address] 08/31/2016, 11:02 AM

## 2016-08-31 NOTE — Progress Notes (Signed)
  Speech Language Pathology Treatment: Dysphagia  Patient Details Name: Walter Williamson MRN: [id number] DOB: March 14, 1932 Today's Date: 08/31/2016 Time: 1415-1440 SLP Time Calculation (min) (ACUTE ONLY): 25 min  Assessment / Plan / Recommendation Clinical Impression  Pt seen at bedside for follow up after BSE completed yesterday. Nursing reports poor intake. SLP encouraged sweet items, that pt may prefer (due to advanced dementia). RN provided meds dissolved in puree, which pt appeared to tolerate well. Dry coughing noted several minutes after puree trial. Per MD notes, lungs are CTA, pt is afebrile. Safe swallow precautions posted at [facility]. ST will continue to follow closely for diet tolerance and education, especially given cough noted today.   HPI HPI: 83 y.o.malewith history of dementia who presented to the [facility] ED with c/o progressive weakness over a week. His facility noted shaking chills, with increasing oxygen requirements, as well as confusion/lethargy. On arrival he had a temperature 102F, HR 42, BP 76/53 and SpO2 86%. Chest xray showed minimal atelectasis in the LLL. UA was suspicious for UTI.       SLP Plan  Continue with current plan of care       Recommendations  Diet recommendations: Dysphagia 2 (fine chop);Thin liquid Liquids provided via: Cup;Straw Medication Administration: Crushed with puree Supervision: Full supervision/cueing for compensatory strategies;Staff to assist with self feeding Compensations: Minimize environmental distractions;Small sips/bites;Slow rate Postural Changes and/or Swallow Maneuvers: Seated upright 90 degrees;Out of bed for meals                Oral Care Recommendations: Oral care BID Follow up Recommendations: Skilled Nursing facility;24 hour supervision/assistance SLP Visit Diagnosis: Dysphagia, oropharyngeal phase (R13.12) Plan: Continue with current plan of care       Ewin Rehberg B. Murvin Natal [facility], CCC-SLP [phone]  Leigh Aurora 08/31/2016, 2:44 PM

## 2016-08-31 NOTE — Evaluation (Signed)
Occupational Therapy Evaluation Patient Details Name: Walter Williamson MRN: [id number] DOB: 07/16/1931 Today's Date: 08/31/2016    History of Present Illness Pt adm with sepsis shock due to UTI. PMH - dementia, tkr,   Clinical Impression   Pt reports he was independent with ADL PTA; no family/caregiver present to confirm PLOF. Currently pt mod assist for functional mobility and max assist overall for ADL with max cues throughout for initiation, sequencing, and safety. Recommending SNF for follow up to maximize independence and safety with ADL and functional mobility. Pt would benefit from continued skilled OT to address established goals.    Follow Up Recommendations  SNF;Supervision/Assistance - 24 hour    Equipment Recommendations  Other (comment) (TBD at next venue)    Recommendations for Other Services       Precautions / Restrictions Precautions Precautions: Fall Restrictions Weight Bearing Restrictions: No      Mobility Bed Mobility Overal bed mobility: Needs Assistance Bed Mobility: Supine to Sit     Supine to sit: Min assist     General bed mobility comments: Assist to elevate trunk to seated position. Cues for sequencing and safety throughout. SpO2 >91% on RA throghout activity; reapplied 2L supplemental O2 at end of session.  Transfers Overall transfer level: Needs assistance Equipment used: Rolling walker (2 wheeled) Transfers: Sit to/from Stand Sit to Stand: Mod assist         General transfer comment: Assist to boost up and acheive full upright standing position.    Balance Overall balance assessment: Needs assistance Sitting-balance support: Feet supported;No upper extremity supported Sitting balance-Leahy Scale: Fair     Standing balance support: Bilateral upper extremity supported Standing balance-Leahy Scale: Poor Standing balance comment: RW for support and external assist                           ADL either performed or assessed  with clinical judgement   ADL Overall ADL's : Needs assistance/impaired     Grooming: Moderate assistance;Sitting;Cueing for safety;Cueing for sequencing   Upper Body Bathing: Maximal assistance;Sitting   Lower Body Bathing: Maximal assistance;Sit to/from stand   Upper Body Dressing : Maximal assistance;Sitting   Lower Body Dressing: Maximal assistance;Sit to/from stand   Toilet Transfer: Moderate assistance;Ambulation;BSC;RW Toilet Transfer Details (indicate cue type and reason): Simulated by sit to stand from EOB with functional mobility in room         Functional mobility during ADLs: Moderate assistance;Rolling walker;Cueing for safety;Cueing for sequencing General ADL Comments: Consistent cues for safety and sequencing     Vision         Perception     Praxis      Pertinent Vitals/Pain Pain Assessment: Faces Faces Pain Scale: No hurt     Hand Dominance     Extremity/Trunk Assessment Upper Extremity Assessment Upper Extremity Assessment: Generalized weakness   Lower Extremity Assessment Lower Extremity Assessment: Defer to PT evaluation   Cervical / Trunk Assessment Cervical / Trunk Assessment: Kyphotic   Communication Communication Communication: HOH   Cognition Arousal/Alertness: Awake/alert Behavior During Therapy: WFL for tasks assessed/performed Overall Cognitive Status: No family/caregiver present to determine baseline cognitive functioning                                 General Comments: Pt with history of dementia   General Comments       Exercises     Shoulder  Instructions      Home Living Family/patient expects to be discharged to:: Skilled nursing facility                                 Additional Comments: Pt from memory care facility      Prior Functioning/Environment Level of Independence: Needs assistance  Gait / Transfers Assistance Needed: Pt states he amb independently without assistive  device (unsure of accuracy) ADL's / Homemaking Assistance Needed: Pt states he is independent with ADL (unsure of accuracy)            OT Problem List: Decreased strength;Decreased activity tolerance;Impaired balance (sitting and/or standing);Decreased cognition;Decreased safety awareness;Decreased knowledge of use of DME or AE      OT Treatment/Interventions: Self-care/ADL training;Therapeutic exercise;Energy conservation;DME and/or AE instruction;Therapeutic activities;Patient/family education;Balance training    OT Goals(Current goals can be found in the care plan section) Acute Rehab OT Goals Patient Stated Goal: none stated OT Goal Formulation: With patient Time For Goal Achievement: 09/14/16 Potential to Achieve Goals: Fair ADL Goals Pt Will Perform Grooming: standing;with min guard assist Pt Will Perform Upper Body Bathing: with min guard assist;sitting Pt Will Perform Lower Body Bathing: with min guard assist;sit to/from stand Pt Will Transfer to Toilet: with min guard assist;ambulating;bedside commode Additional ADL Goal #1: Pt will perform bed mobility with supervision as precursor to ADL.  OT Frequency: Min 2X/week   Barriers to D/C:            Co-evaluation              End of Session Equipment Utilized During Treatment: Gait belt;Rolling walker;Oxygen Nurse Communication: Mobility status  Activity Tolerance: Patient tolerated treatment well Patient left: in chair;with call bell/phone within reach;with chair alarm set;with restraints reapplied  OT Visit Diagnosis: Other abnormalities of gait and mobility (R26.89);Muscle weakness (generalized) (M62.81)                Time: 1610-9604 OT Time Calculation (min): 24 min Charges:  OT General Charges $OT Visit: 1 Procedure OT Evaluation $OT Eval Moderate Complexity: 1 Procedure OT Treatments $Self Care/Home Management : 8-22 mins G-Codes:     Fredric Mare A. Brett Albino, M.S., OTR/L Pager: [phone]  Gaye Alken 08/31/2016, 3:08 PM

## 2016-08-31 NOTE — [place] FL2 (Signed)
[place] MEDICAID FL2 LEVEL OF CARE SCREENING TOOL     IDENTIFICATION  Patient Name: Walter Williamson Birthdate: 31-Mar-1932 Sex: male Admission Date (Current Location): 08/27/2016  [facility] and [place] Number:  Producer, television/film/video and Address:  [facility], [address], [place], [place] [address]      Provider Number: [id number]  Attending Physician Name and Address:  Lonia Blood, MD  Relative Name and Phone Number:       Current Level of Care: Hospital Recommended Level of Care: Skilled Nursing Facility Prior Approval Number:    Date Approved/Denied:   PASRR Number: [id number]  Discharge Plan: SNF    Current Diagnoses: Patient Active Problem List   Diagnosis Date Noted  . Acute respiratory failure with hypoxia (HCC)   . UTI (urinary tract infection) 08/28/2016  . Severe sepsis with septic shock (CODE) (HCC) 08/28/2016  . Dementia 08/28/2016  . AKI (acute kidney injury) (HCC) 08/28/2016    Orientation RESPIRATION BLADDER Height & Weight     Self, Place  O2 (2L) Incontinent, External catheter Weight: 139 lb 14.4 oz (63.5 kg) Height:   (165.1 cm) (best guess per patient's daughter Alvis Lemmings )  BEHAVIORAL SYMPTOMS/MOOD NEUROLOGICAL BOWEL NUTRITION STATUS      Continent Diet (dysphasia 1)  AMBULATORY STATUS COMMUNICATION OF NEEDS Skin   Extensive Assist Verbally Normal                       Personal Care Assistance Level of Assistance  Bathing, Feeding, Dressing Bathing Assistance: Limited assistance Feeding assistance: Limited assistance Dressing Assistance: Limited assistance     Functional Limitations Info  Sight, Hearing, Speech Sight Info: Adequate Hearing Info: Adequate Speech Info: Adequate    SPECIAL CARE FACTORS FREQUENCY  PT (By licensed PT), OT (By licensed OT), Speech therapy     PT Frequency: 5x OT Frequency: 5x     Speech Therapy Frequency: 2x      Contractures      Additional Factors Info  Code  Status, Allergies Code Status Info: full Allergies Info: nka           Current Medications (08/31/2016):  This is the current hospital active medication list Current Facility-Administered Medications  Medication Dose Route Frequency Provider Last Rate Last Dose  . 0.9 %  sodium chloride infusion   Intravenous Continuous Lonia Blood, MD 60 mL/hr at 08/30/16 1900    . acetaminophen (TYLENOL) tablet 650 mg  650 mg Oral Q6H PRN Isaiah Blakes, PA-C       Or  . acetaminophen (TYLENOL) suppository 650 mg  650 mg Rectal Q6H PRN Isaiah Blakes, PA-C      . ALPRAZolam Prudy Feeler) tablet 0.5 mg  0.5 mg Oral TID PRN Isaiah Blakes, PA-C      . aspirin chewable tablet 81 mg  81 mg Oral Daily Isaiah Blakes, PA-C   81 mg at 08/31/16 0843  . bisacodyl (DULCOLAX) EC tablet 10 mg  10 mg Oral Daily PRN Isaiah Blakes, PA-C      . cefTRIAXone (ROCEPHIN) 1 g in dextrose 5 % 50 mL IVPB  1 g Intravenous Q24H Lonia Blood, MD   Stopped at 08/31/16 [phone]  . divalproex (DEPAKOTE SPRINKLE) capsule 125 mg  125 mg Oral BID Isaiah Blakes, PA-C   125 mg at 08/31/16 0843  . donepezil (ARICEPT) tablet 20 mg  20 mg Oral [address], [place]  20 mg at 08/31/16 0843  . finasteride (PROSCAR) tablet 5 mg  5 mg Oral [address] [place], PA-C   5 mg at 08/31/16 8413  . MEDLINE mouth rinse  15 mL Mouth Rinse BID Isaiah Blakes, PA-C   15 mL at 08/31/16 1000  . memantine (NAMENDA) tablet 10 mg  10 mg Oral BID Isaiah Blakes, PA-C   10 mg at 08/31/16 2440  . metoprolol tartrate (LOPRESSOR) tablet 12.5 mg  12.5 mg Oral BID Isaiah Blakes, PA-C   12.5 mg at 08/30/16 2125  . pantoprazole (PROTONIX) EC tablet 40 mg  40 mg Oral [address] [place], PA-C   40 mg at 08/31/16 1027  . QUEtiapine (SEROQUEL) tablet 25 mg  25 mg Oral QHS Isaiah Blakes, PA-C   25 mg at 08/30/16 2125  . simvastatin (ZOCOR) tablet 40 mg  40 mg Oral [address] [place], PA-C   40 mg at  08/31/16 2536  . traZODone (DESYREL) tablet 25 mg  25 mg Oral QHS PRN Isaiah Blakes, PA-C   25 mg at 08/30/16 2125     Discharge Medications: Please see discharge summary for a list of discharge medications.  Relevant Imaging Results:  Relevant Lab Results:   Additional Information SS# [id number]  Nelwyn Salisbury, LCSW

## 2016-09-01 DIAGNOSIS — N179 Acute kidney failure, unspecified: Secondary | ICD-10-CM

## 2016-09-01 MED ORDER — MEMANTINE HCL 10 MG PO TABS: 10.0000 mg | ORAL_TABLET | Freq: Two times a day (BID) | ORAL | 0 refills | Status: AC

## 2016-09-01 MED ORDER — CEFUROXIME AXETIL 250 MG PO TABS: ORAL_TABLET | ORAL | 0 refills | Status: DC

## 2016-09-01 MED ORDER — ALPRAZOLAM 0.5 MG PO TABS: 0.5000 mg | ORAL_TABLET | Freq: Three times a day (TID) | ORAL | 0 refills | Status: DC | PRN

## 2016-09-01 MED ORDER — ACETAMINOPHEN 325 MG PO TABS: 650.0000 mg | ORAL_TABLET | Freq: Four times a day (QID) | ORAL | 0 refills | Status: AC | PRN

## 2016-09-01 MED ORDER — DONEPEZIL HCL 10 MG PO TABS: 10.0000 mg | ORAL_TABLET | ORAL | 0 refills | Status: AC

## 2016-09-01 MED ORDER — CEFUROXIME AXETIL 500 MG PO TABS: 500.0000 mg | ORAL_TABLET | Freq: Two times a day (BID) | ORAL | 0 refills | Status: DC

## 2016-09-01 NOTE — Discharge Summary (Signed)
Walter Williamson, is a 83 y.o. male  DOB 1932/01/17  MRN [id number].  Admission date:  08/27/2016  Admitting Physician  Michael Litter, MD  Discharge Date:  09/01/2016   Primary MD  Malka So., MD  Recommendations for primary care physician for things to follow:   Monitor medications and adjust as appropriate  Admission Diagnosis  Hypoxia [R09.02] Febrile illness [R50.9] Sepsis (HCC) [A41.9]   Discharge Diagnosis  Hypoxia [R09.02] Febrile illness [R50.9] Sepsis (HCC) [A41.9]   Active Problems:   UTI (urinary tract infection)   Severe sepsis with septic shock (CODE) (HCC)   Dementia   AKI (acute kidney injury) (HCC)   Acute respiratory failure with hypoxia (HCC)      Past Medical History:  Diagnosis Date  . Dementia   . UTI (urinary tract infection)     Past Surgical History:  Procedure Laterality Date  . KNEE ARTHROPLASTY         HPI  from the history and physical done on the day of admission:     HPI: Walter Williamson is a 83 y.o. male with medical history significant of dementia, UTI who presented to the ED [facility] with c/o progressive weakness. The patient was ihaving weakness for nearly a week , but yesterday morning he became progressively weak. The facility informed his daughter that the patient was getting weaker and unable to support himself, started having shaking, chills, with increasing oxygen requirements and SpO2 was dropping.  The facility also noted that the patient became confused and more lethargic than usual. The family was concerned and transported the patient to the ED [facility], but eventually he was transferred to the [facility] d/t not step-down bed availability in [facility]  ED Course: On the arrival he was febrile with temperature 102F, bradycardic with HR 42, hypotensive with BP 76/53 and SpO2 86% Blood work demonstrated normal Lactic acid 1.63-->1.01, creatinine 1.38 and thereis no  creatinine available for comparison Chest xray showed minimal atelectasis in the LLL EKG showed sinus bradycardia with LBBB UA was suspicious for UTI    Hospital Course:     Brief summary:- 83 y.o.malewith history of dementia who presented to the [facility] ED with c/o progressive weakness over a week. His Nursing facility noted shaking chills, with increasing oxygen requirements, as well as confusion/lethargy.  On arrival he had a temperature 102F, HR 42, BP 76/53 and SpO2 86%.  Chest xray showed minimal atelectasis in the LLL.  UA was suspicious for UTI.  The pt was transferred to [facility] due to a lack of SDU beds at [facility].    Plan:-  1)Severe Sepsis with septic shock from suspected urinary source- however urine culture from 08/28/16 with mixed flora and blood cx from 08/27/16 w/o growth.  Code sepsis was initiated on arrival , Lactic acid remains normal. Patient did receive IV fluid boluses, she was treated with IV Rocephin, later switched to cefuroxime. Hypoxia resolved  2)AKI -  Creatinine was 1.38, creatinine is normalized at 0.8 now post-hydration.    3)Dementia -patient is  pleasantly confused without significant agitation, continue to  provide supportive care with ADL and feeding, monitor for safety . Continue Xanax and Depakote, Namenda and Seroquel as well as Aricept as prescribed  4)Debility/Weakness- patient with generalized weakness and debility, no new focal deficits, transferred to a skilled nursing facility for rehabilitation  Discharge Condition: Medically improved, cognitive deficits persist  Follow UP  Follow-up Information    JOBE,DANIEL B., MD. Schedule an appointment as soon as possible for a visit in 3 week(s).   Specialty:  Internal Medicine Contact information: [address] [place] [address] [phone]           Diet and Activity recommendation:  As advised  Discharge Instructions    Discharge Instructions    Call MD for:  difficulty  breathing, headache or visual disturbances    Complete by:  As directed    Call MD for:  persistant dizziness or light-headedness    Complete by:  As directed    Call MD for:  persistant nausea and vomiting    Complete by:  As directed    Call MD for:  severe uncontrolled pain    Complete by:  As directed    Call MD for:  temperature >100.4    Complete by:  As directed    Diet - low sodium heart healthy    Complete by:  As directed    Discharge instructions    Complete by:  As directed    Fall precautions Take medications as prescribed   Increase activity slowly    Complete by:  As directed    As per physical therapist        Discharge Medications     Allergies as of 09/01/2016   No Known Allergies     Medication List    TAKE these medications   acetaminophen 325 MG tablet Commonly known as:  TYLENOL Take 2 tablets (650 mg total) by mouth every 6 (six) hours as needed for mild pain (or Fever >/= 101).   ALPRAZolam 0.5 MG tablet Commonly known as:  XANAX Take 1 tablet (0.5 mg total) by mouth 3 (three) times daily as needed. What changed:  reasons to take this   aspirin 81 MG chewable tablet Chew 81 mg by mouth daily.   cefUROXime 250 MG tablet Commonly known as:  CEFTIN 1 tab (250 mg) po bid x 3 days   CENTRUM SILVER 50+MEN PO Take 1 tablet by mouth daily.   divalproex 125 MG capsule Commonly known as:  DEPAKOTE SPRINKLE Take 125 mg by mouth 2 (two) times daily.   donepezil 10 MG tablet Commonly known as:  ARICEPT Take 1 tablet (10 mg total) by mouth every morning. What changed:  how much to take   finasteride 5 MG tablet Commonly known as:  PROSCAR Take 5 mg by mouth every morning.   memantine 10 MG tablet Commonly known as:  NAMENDA Take 1 tablet (10 mg total) by mouth 2 (two) times daily.   metoprolol tartrate 25 MG tablet Commonly known as:  LOPRESSOR Take 12.5 mg by mouth 2 (two) times daily.   pantoprazole 40 MG tablet Commonly known as:   PROTONIX Take 40 mg by mouth every morning.   QUEtiapine 25 MG tablet Commonly known as:  SEROQUEL Take 25 mg by mouth at bedtime.   simvastatin 40 MG tablet Commonly known as:  ZOCOR Take 40 mg by mouth every morning.       Major procedures and  Radiology Reports - PLEASE review detailed and final reports for all details, in brief -   Dg Chest 2 View  Result Date: 08/28/2016 CLINICAL DATA:  Possible sepsis, fever with hypotension EXAM: CHEST  2 VIEW COMPARISON:  None. FINDINGS: Minimal atelectasis at the left lung base. No focal consolidation or large pleural effusion. Heart size upper normal. Aortic atherosclerosis. No pneumothorax. Mild degenerative changes of the spine. IMPRESSION: Minimal atelectasis at the left lung base.  No focal consolidation. Electronically Signed   By: Jasmine Pang M.D.   On: 08/28/2016 00:22   Dg Chest Port 1 View  Result Date: 08/30/2016 CLINICAL DATA:  Shortness of breath EXAM: PORTABLE CHEST 1 VIEW COMPARISON:  08/28/2016 FINDINGS: New generalized interstitial coarsening, suspected edema. No effusions or pneumothorax. No asymmetric density or air bronchogram. Normal heart size and mediastinal contours. IMPRESSION: New mild pulmonary edema. Electronically Signed   By: Marnee Spring M.D.   On: 08/30/2016 07:26    Micro Results   Recent Results (from the past 240 hour(s))  Culture, blood (Routine x 2)     Status: None (Preliminary result)   Collection Time: 08/27/16 11:20 PM  Result Value Ref Range Status   Specimen Description BLOOD RIGHT FOREARM  Final   Special Requests   Final    BOTTLES DRAWN AEROBIC AND ANAEROBIC Blood Culture adequate volume   Culture   Final    NO GROWTH 3 DAYS Performed at [facility], [address] [place], [place] [address]    Report Status PENDING  Incomplete  Culture, blood (Routine x 2)     Status: None (Preliminary result)   Collection Time: 08/27/16 11:25 PM  Result Value Ref Range Status   Specimen  Description BLOOD LEFT ARM  Final   Special Requests   Final    BOTTLES DRAWN AEROBIC AND ANAEROBIC Blood Culture adequate volume   Culture   Final    NO GROWTH 3 DAYS Performed at [facility], [address] [place], [place] [address]    Report Status PENDING  Incomplete  Urine culture     Status: Abnormal   Collection Time: 08/28/16 12:47 AM  Result Value Ref Range Status   Specimen Description URINE, CATHETERIZED  Final   Special Requests NONE  Final   Culture MULTIPLE SPECIES PRESENT, SUGGEST RECOLLECTION (A)  Final   Report Status 08/29/2016 FINAL  Final  MRSA PCR Screening     Status: None   Collection Time: 08/28/16  7:56 AM  Result Value Ref Range Status   MRSA by PCR NEGATIVE NEGATIVE Final    Comment:        The GeneXpert MRSA Assay (FDA approved for NASAL specimens only), is one component of a comprehensive MRSA colonization surveillance program. It is not intended to diagnose MRSA infection nor to guide or monitor treatment for MRSA infections.        Today   Subjective    Walter Williamson today has no New complaints             Patient has been seen and examined prior to discharge   Objective   Blood pressure 103/84, pulse 80, temperature 98.2 F (36.8 C), temperature source Oral, resp. rate 17, height  (1.651 m), weight 63.5 kg (139 lb 14.4 oz), SpO2 98 %.   Intake/Output Summary (Last 24 hours) at 09/01/16 1433 Last data filed at 09/01/16 1301  Gross per 24 hour  Intake  290 ml  Output              600 ml  Net             -310 ml    Exam Gen:- Awake  , pleasantly confused HEENT:- .AT,   Neck-Supple Neck,No JVD,  Lungs- mostly clear  CV- S1, S2 normal Abd-  +ve B.Sounds, Abd Soft, No tenderness,    Extremity/Skin:- Intact peripheral pulses   GU-condom catheter with clear urine   Data Review   CBC w Diff:  Lab Results  Component Value Date   WBC 6.4 08/31/2016   HGB 11.2 (L) 08/31/2016   HCT 34.4 (L)  08/31/2016   PLT 104 (L) 08/31/2016   LYMPHOPCT 18 08/27/2016   MONOPCT 11 08/27/2016   EOSPCT 0 08/27/2016   BASOPCT 0 08/27/2016    CMP:  Lab Results  Component Value Date   NA 138 08/31/2016   K 3.1 (L) 08/31/2016   CL 109 08/31/2016   CO2 24 08/31/2016   BUN 9 08/31/2016   CREATININE 0.86 08/31/2016   PROT 4.9 (L) 08/31/2016   ALBUMIN 2.4 (L) 08/31/2016   BILITOT 0.9 08/31/2016   ALKPHOS 45 08/31/2016   AST 27 08/31/2016   ALT 19 08/31/2016  .   Total Discharge time is about 33 minutes  Walter Williamson M.D on 09/01/2016 at 2:33 PM  [facility]   Office  [phone]  Dragon dictation system was used to create this note, attempts have been made to correct errors, however presence of uncorrected errors is not a reflection quality of care provided

## 2016-09-01 NOTE — Clinical Social Work Note (Signed)
Clinical Social Worker facilitated patient discharge including contacting patient family and facility to confirm patient discharge plans.  Clinical information faxed to facility and family agreeable with plan.  CSW arranged ambulance transport via [facility] to [facility].  RN to call report prior to discharge.  Clinical Social Worker will sign off for now as social work intervention is no longer needed. Please consult us again if new need arises.  Jesse Burnham Trost, LCSW [phone] 

## 2016-09-01 NOTE — Clinical Social Work Placement (Signed)
   CLINICAL SOCIAL WORK PLACEMENT  NOTE  Date:  09/01/2016  Patient Details  Name: Walter Williamson MRN: [id number] Date of Birth: November 27, 1931  Clinical Social Work is seeking post-discharge placement for this patient at the Skilled  Nursing Facility level of care (*CSW will initial, date and re-position this form in  chart as items are completed):  Yes   Patient/family provided with [facility] Clinical Social Work Department's list of facilities offering this level of care within the geographic area requested by the patient (or if unable, by the patient's family).  Yes   Patient/family informed of their freedom to choose among providers that offer the needed level of care, that participate in Medicare, Medicaid or managed care program needed by the patient, have an available bed and are willing to accept the patient.  Yes   Patient/family informed of [facility]'s ownership interest in [facility] and [facility], as well as of the fact that they are under no obligation to receive care at these facilities.  PASRR submitted to EDS on 08/31/16     PASRR number received on 08/31/16     Existing PASRR number confirmed on       FL2 transmitted to all facilities in geographic area requested by pt/family on 08/31/16     FL2 transmitted to all facilities within larger geographic area on       Patient informed that his/her managed care company has contracts with or will negotiate with certain facilities, including the following:        Yes   Patient/family informed of bed offers received.  Patient chooses bed at [facility]     Physician recommends and patient chooses bed at      Patient to be transferred to [facility] on 09/01/16.  Patient to be transferred to facility by Ambulance     Patient family notified on 09/01/16 of transfer.  Name of family member notified:  Patient daughter at bedside     PHYSICIAN       Additional Comment:    Macario Golds, LCSW [phone]

## 2016-09-01 NOTE — Care Management Important Message (Signed)
Important Message  Patient Details  Name: Walter Williamson MRN: [id number] Date of Birth: [birth year]   Medicare Important Message Given:  Yes    Kyla Balzarine 09/01/2016, 1:05 PM

## 2016-09-01 NOTE — Progress Notes (Signed)
Report given to RN at [facility]. Sanda Linger, RN

## 2016-09-02 LAB — CULTURE, BLOOD (ROUTINE X 2)
CULTURE: NO GROWTH
CULTURE: NO GROWTH
SPECIAL REQUESTS: ADEQUATE
SPECIAL REQUESTS: ADEQUATE

## 2016-12-10 ENCOUNTER — Encounter (HOSPITAL_COMMUNITY): Payer: Self-pay

## 2016-12-10 ENCOUNTER — Inpatient Hospital Stay (HOSPITAL_COMMUNITY)
Admission: EM | Admit: 2016-12-10 | Discharge: 2016-12-14 | DRG: 689 | Disposition: A | Discharge status: Skilled Nursing Facility | Payer: Medicare Other | Attending: Internal Medicine | Admitting: Internal Medicine

## 2016-12-10 ENCOUNTER — Emergency Department (HOSPITAL_COMMUNITY): Payer: Medicare Other

## 2016-12-10 DIAGNOSIS — R9431 Abnormal electrocardiogram [ECG] [EKG]: Secondary | ICD-10-CM

## 2016-12-10 DIAGNOSIS — N179 Acute kidney failure, unspecified: Secondary | ICD-10-CM | POA: Diagnosis present

## 2016-12-10 DIAGNOSIS — Z79899 Other long term (current) drug therapy: Secondary | ICD-10-CM

## 2016-12-10 DIAGNOSIS — F0281 Dementia in other diseases classified elsewhere with behavioral disturbance: Secondary | ICD-10-CM | POA: Diagnosis present

## 2016-12-10 DIAGNOSIS — Z66 Do not resuscitate: Secondary | ICD-10-CM | POA: Diagnosis present

## 2016-12-10 DIAGNOSIS — E860 Dehydration: Secondary | ICD-10-CM | POA: Diagnosis present

## 2016-12-10 DIAGNOSIS — G9389 Other specified disorders of brain: Secondary | ICD-10-CM | POA: Diagnosis present

## 2016-12-10 DIAGNOSIS — G309 Alzheimer's disease, unspecified: Secondary | ICD-10-CM | POA: Diagnosis present

## 2016-12-10 DIAGNOSIS — R2681 Unsteadiness on feet: Secondary | ICD-10-CM | POA: Diagnosis present

## 2016-12-10 DIAGNOSIS — E440 Moderate protein-calorie malnutrition: Secondary | ICD-10-CM | POA: Diagnosis present

## 2016-12-10 DIAGNOSIS — Z87440 Personal history of urinary (tract) infections: Secondary | ICD-10-CM

## 2016-12-10 DIAGNOSIS — E785 Hyperlipidemia, unspecified: Secondary | ICD-10-CM | POA: Diagnosis present

## 2016-12-10 DIAGNOSIS — F03918 Unspecified dementia, unspecified severity, with other behavioral disturbance: Secondary | ICD-10-CM

## 2016-12-10 DIAGNOSIS — G9340 Encephalopathy, unspecified: Secondary | ICD-10-CM | POA: Diagnosis present

## 2016-12-10 DIAGNOSIS — Z7982 Long term (current) use of aspirin: Secondary | ICD-10-CM

## 2016-12-10 DIAGNOSIS — I10 Essential (primary) hypertension: Secondary | ICD-10-CM | POA: Diagnosis present

## 2016-12-10 DIAGNOSIS — N390 Urinary tract infection, site not specified: Principal | ICD-10-CM | POA: Diagnosis present

## 2016-12-10 DIAGNOSIS — G9341 Metabolic encephalopathy: Secondary | ICD-10-CM

## 2016-12-10 DIAGNOSIS — R9089 Other abnormal findings on diagnostic imaging of central nervous system: Secondary | ICD-10-CM

## 2016-12-10 DIAGNOSIS — I447 Left bundle-branch block, unspecified: Secondary | ICD-10-CM | POA: Diagnosis present

## 2016-12-10 DIAGNOSIS — F0391 Unspecified dementia with behavioral disturbance: Secondary | ICD-10-CM | POA: Diagnosis present

## 2016-12-10 DIAGNOSIS — R400 Somnolence: Secondary | ICD-10-CM

## 2016-12-10 DIAGNOSIS — B964 Proteus (mirabilis) (morganii) as the cause of diseases classified elsewhere: Secondary | ICD-10-CM | POA: Diagnosis present

## 2016-12-10 DIAGNOSIS — I4581 Long QT syndrome: Secondary | ICD-10-CM | POA: Diagnosis present

## 2016-12-10 DIAGNOSIS — R32 Unspecified urinary incontinence: Secondary | ICD-10-CM | POA: Diagnosis present

## 2016-12-10 LAB — COMPREHENSIVE METABOLIC PANEL
ALT: 17 U/L (ref 17–63)
AST: 24 U/L (ref 15–41)
Albumin: 3.5 g/dL (ref 3.5–5.0)
Alkaline Phosphatase: 62 U/L (ref 38–126)
Anion gap: 8 (ref 5–15)
BUN: 41 mg/dL — ABNORMAL HIGH (ref 6–20)
CALCIUM: 10 mg/dL (ref 8.9–10.3)
CHLORIDE: 111 mmol/L (ref 101–111)
CO2: 26 mmol/L (ref 22–32)
CREATININE: 1.31 mg/dL — AB (ref 0.61–1.24)
GFR, EST AFRICAN AMERICAN: 56 mL/min — AB (ref >60)
GFR, EST NON AFRICAN AMERICAN: 49 mL/min — AB (ref >60)
Glucose, Bld: 110 mg/dL — ABNORMAL HIGH (ref 65–99)
Potassium: 4.7 mmol/L (ref 3.5–5.1)
Sodium: 145 mmol/L (ref 135–145)
TOTAL PROTEIN: 6.8 g/dL (ref 6.5–8.1)
Total Bilirubin: 1 mg/dL (ref 0.3–1.2)

## 2016-12-10 LAB — URINALYSIS, ROUTINE W REFLEX MICROSCOPIC
BILIRUBIN URINE: NEGATIVE
GLUCOSE, UA: NEGATIVE mg/dL
Hgb urine dipstick: NEGATIVE
KETONES UR: NEGATIVE mg/dL
NITRITE: NEGATIVE
PH: 8 (ref 5.0–8.0)
PROTEIN: 100 mg/dL — AB
Specific Gravity, Urine: 1.013 (ref 1.005–1.030)

## 2016-12-10 LAB — CBC WITH DIFFERENTIAL/PLATELET
BASOS ABS: 0 10*3/uL (ref 0.0–0.1)
Basophils Relative: 0 %
EOS ABS: 0 10*3/uL (ref 0.0–0.7)
Eosinophils Relative: 0 %
HCT: 40.1 % (ref 39.0–52.0)
HEMOGLOBIN: 13.1 g/dL (ref 13.0–17.0)
LYMPHS ABS: 1.5 10*3/uL (ref 0.7–4.0)
Lymphocytes Relative: 13 %
MCH: 33.2 pg (ref 26.0–34.0)
MCHC: 32.7 g/dL (ref 30.0–36.0)
MCV: 101.5 fL — ABNORMAL HIGH (ref 78.0–100.0)
Monocytes Absolute: 1.2 10*3/uL — ABNORMAL HIGH (ref 0.1–1.0)
Monocytes Relative: 10 %
NEUTROS PCT: 77 %
Neutro Abs: 9.3 10*3/uL — ABNORMAL HIGH (ref 1.7–7.7)
Platelets: 213 10*3/uL (ref 150–400)
RBC: 3.95 MIL/uL — AB (ref 4.22–5.81)
RDW: 14.1 % (ref 11.5–15.5)
WBC: 12 10*3/uL — AB (ref 4.0–10.5)

## 2016-12-10 LAB — VALPROIC ACID LEVEL: Valproic Acid Lvl: <10 ug/mL — ABNORMAL LOW (ref 50.0–100.0)

## 2016-12-10 LAB — I-STAT TROPONIN, ED: TROPONIN I, POC: 0.01 ng/mL (ref 0.00–0.08)

## 2016-12-10 LAB — MRSA PCR SCREENING: MRSA by PCR: NEGATIVE

## 2016-12-10 LAB — I-STAT CG4 LACTIC ACID, ED: LACTIC ACID, VENOUS: 1.9 mmol/L (ref 0.5–1.9)

## 2016-12-10 MED ORDER — ASPIRIN 81 MG PO CHEW
81.0000 mg | CHEWABLE_TABLET | Freq: Every day | ORAL | Status: DC
  Administered 2016-12-11 – 2016-12-14 (×4): 81 mg via ORAL
  Filled 2016-12-10 (×4): qty 1

## 2016-12-10 MED ORDER — ENSURE ENLIVE PO LIQD
237.0000 mL | Freq: Two times a day (BID) | ORAL | Status: DC
  Administered 2016-12-11 – 2016-12-14 (×6): 237 mL via ORAL

## 2016-12-10 MED ORDER — SODIUM CHLORIDE 0.9 % IV BOLUS (SEPSIS)
1000.0000 mL | Freq: Once | INTRAVENOUS | Status: AC
  Administered 2016-12-10: 1000 mL via INTRAVENOUS

## 2016-12-10 MED ORDER — MEMANTINE HCL 10 MG PO TABS: 10.0000 mg | ORAL_TABLET | Freq: Two times a day (BID) | ORAL | Status: DC

## 2016-12-10 MED ORDER — SIMVASTATIN 40 MG PO TABS
40.0000 mg | ORAL_TABLET | Freq: Every day | ORAL | Status: DC
  Administered 2016-12-10 – 2016-12-14 (×5): 40 mg via ORAL
  Filled 2016-12-10 (×5): qty 1

## 2016-12-10 MED ORDER — DEXTROSE 5 % IV SOLN
1.0000 g | INTRAVENOUS | Status: DC
  Administered 2016-12-11 – 2016-12-13 (×3): 1 g via INTRAVENOUS
  Filled 2016-12-10 (×3): qty 10

## 2016-12-10 MED ORDER — DEXTROSE 5 % IV SOLN
1.0000 g | Freq: Once | INTRAVENOUS | Status: AC
  Administered 2016-12-10: 1 g via INTRAVENOUS
  Filled 2016-12-10: qty 10

## 2016-12-10 MED ORDER — DONEPEZIL HCL 5 MG PO TABS: 10.0000 mg | ORAL_TABLET | ORAL | Status: DC

## 2016-12-10 MED ORDER — ONDANSETRON HCL 4 MG PO TABS: 4.0000 mg | ORAL_TABLET | Freq: Four times a day (QID) | ORAL | Status: DC | PRN

## 2016-12-10 MED ORDER — SODIUM CHLORIDE 0.9 % IV SOLN
INTRAVENOUS | Status: DC
  Administered 2016-12-10: 17:00:00 via INTRAVENOUS

## 2016-12-10 MED ORDER — FINASTERIDE 5 MG PO TABS
5.0000 mg | ORAL_TABLET | Freq: Every day | ORAL | Status: DC
  Administered 2016-12-11 – 2016-12-14 (×4): 5 mg via ORAL
  Filled 2016-12-10 (×4): qty 1

## 2016-12-10 MED ORDER — DIVALPROEX SODIUM 125 MG PO CSDR: 125.0000 mg | DELAYED_RELEASE_CAPSULE | Freq: Two times a day (BID) | ORAL | Status: DC

## 2016-12-10 MED ORDER — PANTOPRAZOLE SODIUM 40 MG PO TBEC: 40.0000 mg | DELAYED_RELEASE_TABLET | ORAL | Status: DC

## 2016-12-10 MED ORDER — ACETAMINOPHEN 325 MG PO TABS
650.0000 mg | ORAL_TABLET | Freq: Four times a day (QID) | ORAL | Status: DC | PRN
  Administered 2016-12-10: 650 mg via ORAL
  Filled 2016-12-10: qty 2

## 2016-12-10 MED ORDER — SENNOSIDES-DOCUSATE SODIUM 8.6-50 MG PO TABS: 1.0000 | ORAL_TABLET | Freq: Every evening | ORAL | Status: DC | PRN

## 2016-12-10 MED ORDER — ONDANSETRON HCL 4 MG/2ML IJ SOLN: 4.0000 mg | Freq: Four times a day (QID) | INTRAMUSCULAR | Status: DC | PRN

## 2016-12-10 MED ORDER — BISACODYL 10 MG RE SUPP: 10.0000 mg | Freq: Every day | RECTAL | Status: DC | PRN

## 2016-12-10 MED ORDER — QUETIAPINE FUMARATE 25 MG PO TABS: 25.0000 mg | ORAL_TABLET | Freq: Every day | ORAL | Status: DC

## 2016-12-10 MED ORDER — METOPROLOL TARTRATE 25 MG PO TABS
12.5000 mg | ORAL_TABLET | Freq: Two times a day (BID) | ORAL | Status: DC
  Administered 2016-12-10 – 2016-12-14 (×8): 12.5 mg via ORAL
  Filled 2016-12-10 (×8): qty 1

## 2016-12-10 MED ORDER — ENOXAPARIN SODIUM 40 MG/0.4ML ~~LOC~~ SOLN
40.0000 mg | SUBCUTANEOUS | Status: DC
  Administered 2016-12-10 – 2016-12-13 (×4): 40 mg via SUBCUTANEOUS
  Filled 2016-12-10 (×4): qty 0.4

## 2016-12-10 NOTE — ED Triage Notes (Signed)
Patient present with altered mental status started two days ago. Pt from nursing home [facility]. Nurse reported patient more confused and weak today more than usually per ems. Pt have hx of dementia.

## 2016-12-10 NOTE — ED Provider Notes (Signed)
[facility]-EMERGENCY DEPT Provider Note   CSN: [id number] Arrival date & time: 12/10/16  [id number]     History   Chief Complaint Chief Complaint  Patient presents with  . Altered Mental Status    HPI Walter Williamson is a 83 y.o. male.  Patient is an 83 year old male with past medical history of Alzheimer's dementia, urinary tract infection. He was sent from his extended care facility for evaluation of altered mental status. According to the patient's daughter he is less responsive and much more sluggish than normal. He says he was in this same condition when he was diagnosed with a urinary tract infection several months ago. The patient denies he is experiencing discomfort. He denies any specific complaints.   The history is provided by the patient.  Altered Mental Status   This is a new problem. The current episode started yesterday. The problem has been rapidly worsening. Associated symptoms include somnolence and weakness. His past medical history is significant for dementia.    Past Medical History:  Diagnosis Date  . Dementia   . UTI (urinary tract infection)     Patient Active Problem List   Diagnosis Date Noted  . Acute respiratory failure with hypoxia (HCC)   . UTI (urinary tract infection) 08/28/2016  . Severe sepsis with septic shock (CODE) (HCC) 08/28/2016  . Dementia 08/28/2016  . AKI (acute kidney injury) (HCC) 08/28/2016    Past Surgical History:  Procedure Laterality Date  . KNEE ARTHROPLASTY         Home Medications    Prior to Admission medications   Medication Sig Start Date End Date Taking? Authorizing Provider  acetaminophen (TYLENOL) 325 MG tablet Take 2 tablets (650 mg total) by mouth every 6 (six) hours as needed for mild pain (or Fever >/= 101). 09/01/16   Emokpae, Courage, MD  ALPRAZolam (XANAX) 0.5 MG tablet Take 1 tablet (0.5 mg total) by mouth 3 (three) times daily as needed. 09/01/16   Shon HaleEmokpae, Courage, MD  aspirin 81 MG chewable tablet Chew 81 mg by  mouth daily.    [provider]  cefUROXime (CEFTIN) 250 MG tablet 1 tab (250 mg) po bid x 3 days 09/01/16   Shon HaleEmokpae, Courage, MD  divalproex (DEPAKOTE SPRINKLE) 125 MG capsule Take 125 mg by mouth 2 (two) times daily. 08/11/16   [provider]  donepezil (ARICEPT) 10 MG tablet Take 1 tablet (10 mg total) by mouth every morning. 09/01/16   Shon HaleEmokpae, Courage, MD  finasteride (PROSCAR) 5 MG tablet Take 5 mg by mouth every morning. 08/12/16   [provider]  memantine (NAMENDA) 10 MG tablet Take 1 tablet (10 mg total) by mouth 2 (two) times daily. 09/01/16   Shon HaleEmokpae, Courage, MD  metoprolol tartrate (LOPRESSOR) 25 MG tablet Take 12.5 mg by mouth 2 (two) times daily. 08/17/16   [provider]  Multiple Vitamins-Minerals (CENTRUM SILVER 50+MEN PO) Take 1 tablet by mouth daily.    [provider]  pantoprazole (PROTONIX) 40 MG tablet Take 40 mg by mouth every morning. 07/28/16   [provider]  QUEtiapine (SEROQUEL) 25 MG tablet Take 25 mg by mouth at bedtime. 08/19/16   [provider]  simvastatin (ZOCOR) 40 MG tablet Take 40 mg by mouth every morning. 08/23/16   [provider]    Family History Family History  Problem Relation Age of Onset  . Dementia Mother     Social History Social History  Substance Use Topics  . Smoking status: Never Smoker  .  Smokeless tobacco: Never Used  . Alcohol use No     Allergies   Patient has no known allergies.   Review of Systems Review of Systems  Neurological: Positive for weakness.  All other systems reviewed and are negative.    Physical Exam Updated Vital Signs BP 129/69   Pulse 86   Temp 98.6 F (37 C) (Rectal)   Resp 18   SpO2 93%   Physical Exam  Constitutional: He is oriented to person, place, and time. He appears well-developed and well-nourished. No distress.  Patient is an 83 year old male in no acute distress. He is somnolent, but arousable and follows commands.   HENT:  Head: Normocephalic and atraumatic.  Mouth/Throat: Oropharynx is clear and moist.  Neck: Normal range of motion. Neck supple.  Cardiovascular: Normal rate and regular rhythm.  Exam reveals no friction rub.   No murmur heard. Pulmonary/Chest: Effort normal and breath sounds normal. No respiratory distress. He has no wheezes. He has no rales.  Abdominal: Soft. Bowel sounds are normal. He exhibits no distension. There is no tenderness.  Musculoskeletal: Normal range of motion. He exhibits no edema.  Neurological: He is oriented to person, place, and time. No cranial nerve deficit. He exhibits normal muscle tone. Coordination normal.  Patient is somnolent but will move all extremities to command. Full neuro exam is somewhat limited secondary to the patient's underlying condition.  Skin: Skin is warm and dry. He is not diaphoretic.  Nursing note and vitals reviewed.    ED Treatments / Results  Labs (all labs ordered are listed, but only abnormal results are displayed) Labs Reviewed  COMPREHENSIVE METABOLIC PANEL  CBC WITH DIFFERENTIAL/PLATELET  URINALYSIS, ROUTINE W REFLEX MICROSCOPIC  I-STAT TROPONIN, ED  I-STAT CG4 LACTIC ACID, ED    EKG  EKG Interpretation  Date/Time:  Saturday December 10 2016 09:32:28 EDT Ventricular Rate:  64 PR Interval:    QRS Duration: 159 QT Interval:  449 QTC Calculation: 464 R Axis:   -33 Text Interpretation:  Sinus rhythm Left bundle branch block Confirmed by Geoffery LyonseLo, Victorine Mcnee ([id number]) on 12/10/2016 9:49:27 AM       Radiology No results found.  Procedures Procedures (including critical care time)  Medications Ordered in ED Medications  sodium chloride 0.9 % bolus 1,000 mL (not administered)     Initial Impression / Assessment and Plan / ED Course  I have reviewed the triage vital signs and the nursing notes.  Pertinent labs & imaging results that were available during my care of the patient were reviewed by me and considered in my  medical decision making (see chart for details).  Patient does have a UTI and slight leukocytosis. He was given Rocephin and will be admitted to the hospitalist service for observation and further antibiotics.  Final Clinical Impressions(s) / ED Diagnoses   Final diagnoses:  None    New Prescriptions New Prescriptions   No medications on file     Geoffery Lyonselo, Laine Giovanetti, MD 12/10/16 1329

## 2016-12-10 NOTE — H&P (Signed)
History and Physical    Rodel Glaspy ZOX:[id number] DOB: April 09, 1932 DOA: 12/10/2016  PCP: Malka So., MD  Patient coming from: [facility]   Chief Complaint: Altered mental status  HPI: Shin Lamour is a 83 y.o. male with medical history significant Alzheimer's disease with behavioral aggression, multiple urinary tract infections most recently 3 months ago who was in his usual state of poor health living in an assisted living facility until 3 days prior to admission when he was noted to be a little bit more aggressive than usual. On the day before admission he was noted to be more lethargic than usual. His daughter was called and she found him to be in the same state that he was in when he was admitted for urosepsis earlier this year.  Patient himself is without complaints. He denies confusion. He denies any pain. He denies any dysuria or feeling weak.  ED Course: Workup in the emergency room reveals a normotensive patient with grossly abnormal urine with multiple bacteria and leuk esterase in a lethargic patient. He was treated with 1 L of normal saline and ceftriaxone.  Review of Systems: As per HPI otherwise 10 point review of systems negative.    Past Medical History:  Diagnosis Date  . Dementia   . UTI (urinary tract infection)     Past Surgical History:  Procedure Laterality Date  . KNEE ARTHROPLASTY       reports that he has never smoked. He has never used smokeless tobacco. He reports that he does not drink alcohol or use drugs.  No Known Allergies  Family History  Problem Relation Age of Onset  . Dementia Mother     Family history was reviewed and is noncontributory  Prior to Admission medications   Medication Sig Start Date End Date Taking? Authorizing Provider  acetaminophen (TYLENOL) 325 MG tablet Take 2 tablets (650 mg total) by mouth every 6 (six) hours as needed for mild pain (or Fever >/= 101). 09/01/16   Emokpae, Courage, MD  ALPRAZolam (XANAX) 0.5 MG tablet Take  1 tablet (0.5 mg total) by mouth 3 (three) times daily as needed. 09/01/16   Shon Hale, MD  aspirin 81 MG chewable tablet Chew 81 mg by mouth daily.    [provider]  cefUROXime (CEFTIN) 250 MG tablet 1 tab (250 mg) po bid x 3 days 09/01/16   Shon Hale, MD  divalproex (DEPAKOTE SPRINKLE) 125 MG capsule Take 125 mg by mouth 2 (two) times daily. 08/11/16   [provider]  donepezil (ARICEPT) 10 MG tablet Take 1 tablet (10 mg total) by mouth every morning. 09/01/16   Shon Hale, MD  finasteride (PROSCAR) 5 MG tablet Take 5 mg by mouth every morning. 08/12/16   [provider]  memantine (NAMENDA) 10 MG tablet Take 1 tablet (10 mg total) by mouth 2 (two) times daily. 09/01/16   Shon Hale, MD  metoprolol tartrate (LOPRESSOR) 25 MG tablet Take 12.5 mg by mouth 2 (two) times daily. 08/17/16   [provider]  Multiple Vitamins-Minerals (CENTRUM SILVER 50+MEN PO) Take 1 tablet by mouth daily.    [provider]  pantoprazole (PROTONIX) 40 MG tablet Take 40 mg by mouth every morning. 07/28/16   [provider]  QUEtiapine (SEROQUEL) 25 MG tablet Take 25 mg by mouth at bedtime. 08/19/16   [provider]  simvastatin (ZOCOR) 40 MG tablet Take 40 mg by mouth every morning. 08/23/16   [provider]    Physical Exam:  Vitals:   12/10/16 1215 12/10/16 1230 12/10/16 1245 12/10/16 1300  BP: 121/65 113/67 120/87 (!) 142/79  Pulse: 64 (!) 51 77 81  Resp: 14 12 20 16   Temp:      TempSrc:      SpO2: 91% 92% (!) 86% 98%    Constitutional: NAD, calm, comfortable Vitals:   12/10/16 1215 12/10/16 1230 12/10/16 1245 12/10/16 1300  BP: 121/65 113/67 120/87 (!) 142/79  Pulse: 64 (!) 51 77 81  Resp: 14 12 20 16   Temp:      TempSrc:      SpO2: 91% 92% (!) 86% 98%   Gen.: Sleepy man lying in bed in no apparent distress. Easily arousable to voice alone. Eyes: PERRL, lids and conjunctivae normal ENMT: Mucous membranes are  dry. Respiratory: Moderately good air entry without any adventitious sounds.  Cardiovascular: Distant heart sounds, regular Abdomen:  Normoactive bowel sounds. Abdomen is firm but not hard. No tenderness no guarding and no rebound. Musculoskeletal: no clubbing / cyanosis. No joint deformity upper and lower extremities.  Skin: no rashes, lesions, ulcers. No induration Neurologic: Lethargic but arousable to voice alone, answers questions appropriately if possibly inaccurately. Moving all 4 extremities spontaneously. Has fine bilateral tremor in upper extremities.   Labs on Admission: I have personally reviewed following labs and imaging studies  CBC:  Recent Labs Lab 12/10/16 1041  WBC 12.0*  NEUTROABS 9.3*  HGB 13.1  HCT 40.1  MCV 101.5*  PLT 213   Basic Metabolic Panel:  Recent Labs Lab 12/10/16 1041  NA 145  K 4.7  CL 111  CO2 26  GLUCOSE 110*  BUN 41*  CREATININE 1.31*  CALCIUM 10.0   GFR: CrCl cannot be calculated (Unknown ideal weight.). Liver Function Tests:  Recent Labs Lab 12/10/16 1041  AST 24  ALT 17  ALKPHOS 62  BILITOT 1.0  PROT 6.8  ALBUMIN 3.5   No results for input(s): LIPASE, AMYLASE in the last 168 hours. No results for input(s): AMMONIA in the last 168 hours. Coagulation Profile: No results for input(s): INR, PROTIME in the last 168 hours. Cardiac Enzymes: No results for input(s): CKTOTAL, CKMB, CKMBINDEX, TROPONINI in the last 168 hours. BNP (last 3 results) No results for input(s): PROBNP in the last 8760 hours. HbA1C: No results for input(s): HGBA1C in the last 72 hours. CBG: No results for input(s): GLUCAP in the last 168 hours. Lipid Profile: No results for input(s): CHOL, HDL, LDLCALC, TRIG, CHOLHDL, LDLDIRECT in the last 72 hours. Thyroid Function Tests: No results for input(s): TSH, T4TOTAL, FREET4, T3FREE, THYROIDAB in the last 72 hours. Anemia Panel: No results for input(s): VITAMINB12, FOLATE, FERRITIN, TIBC, IRON,  RETICCTPCT in the last 72 hours. Urine analysis:    Component Value Date/Time   COLORURINE YELLOW 12/10/2016 1204   APPEARANCEUR CLOUDY (A) 12/10/2016 1204   LABSPEC 1.013 12/10/2016 1204   PHURINE 8.0 12/10/2016 1204   GLUCOSEU NEGATIVE 12/10/2016 1204   HGBUR NEGATIVE 12/10/2016 1204   BILIRUBINUR NEGATIVE 12/10/2016 1204   KETONESUR NEGATIVE 12/10/2016 1204   PROTEINUR 100 (A) 12/10/2016 1204   NITRITE NEGATIVE 12/10/2016 1204   LEUKOCYTESUR LARGE (A) 12/10/2016 1204    Radiological Exams on Admission: Dg Chest 2 View  Result Date: 12/10/2016 CLINICAL DATA:  83 year old male with increase weakness and altered mental status over the last 2 days EXAM: CHEST  2 VIEW COMPARISON:  Prior chest x-ray 08/30/2016 FINDINGS: The lungs are clear and negative for focal airspace consolidation, pulmonary edema or suspicious  pulmonary nodule. Mild pulmonary vascular congestion without overt edema. No pleural effusion or pneumothorax. Cardiac and mediastinal contours are within normal limits. Trace atherosclerotic calcifications in the transverse aorta. No acute fracture or lytic or blastic osseous lesions. The visualized upper abdominal bowel gas pattern is unremarkable. IMPRESSION: 1. Minimal pulmonary vascular congestion without overt edema. 2.  Aortic Atherosclerosis (ICD10-170.0) Electronically Signed   By: Malachy MoanHeath  McCullough M.D.   On: 12/10/2016 10:57   Ct Head Wo Contrast  Result Date: 12/10/2016 CLINICAL DATA:  83 year old male with acute altered mental status 2 days ago. EXAM: CT HEAD WITHOUT CONTRAST TECHNIQUE: Contiguous axial images were obtained from the base of the skull through the vertex without intravenous contrast. COMPARISON:  01/18/2011 MR FINDINGS: Brain: Atrophy and chronic small-vessel white matter ischemic changes again noted. Ventriculomegaly has increased since 01/18/2011 - communicating hydrocephalus is not excluded. There is no evidence of acute infarction, hemorrhage, midline  shift, mass effect or extra-axial collection. Vascular: Intracranial atherosclerotic calcifications noted. Skull: Normal. Negative for fracture or focal lesion. Sinuses/Orbits: No acute finding. Opacified right sphenoid sinus again noted. Other: None IMPRESSION: Increased ventriculomegaly since 01/18/2011 - communicating hydrocephalus not excluded. No other acute abnormality identified. Atrophy and chronic small-vessel white matter ischemic changes. Electronically Signed   By: Harmon PierJeffrey  Hu M.D.   On: 12/10/2016 10:24    EKG: Independently reviewed. Normal sinus rhythm at 60, left bundle branch block noted. Of note is QT of 449 with a QTC of 464.  Assessment/Plan Principal Problem:   Acute metabolic encephalopathy Active Problems:   UTI (urinary tract infection)   Dementia   AKI (acute kidney injury) (HCC)   Acute encephalopathy   QT prolongation   Abnormal CT of brain  ALTERED MENTAL STATUS Likely secondary to urinary tract infection and dehydration with mild renal insufficiency complicating baseline Alzheimer's dementia. Would expect to see improvement in mental status with hydration and treatment of urinary tract infection.  UTI Previous urinary cultures show multiple bacteria likely secondary to poor specimen. He has recently been treated with cefuroxime as an outpatient. Urine culture has been sent today. We will treat with ceftriaxone S patient is in an assisted living facility and is incontinent. He was also most recently in-house in May 2018.  ACUTE KIDNEY INJURY Likely secondary to infection and dehydration. We'll treat with hydration and repeat creatinine in the morning. New  QT PROLONGATION Patient does have left bundle branch block but is also noted to have QTC of 464. He has been discontinued off his Depakote and Seroquel as an outpatient. I will hold his pantoprazole while in-house.  ABNORMAL CT BRAIN CT done today reveals increased ventriculomegaly since 01/18/2011 with a  communicating hydrocephalus not excluded. This is most likely secondary to increasing brain atrophy given known Alzheimer's disease. However patient has recently developed incontinence of urine and clearly has an unstable gait. I am not sure that workup for normal pressure hydrocephalus would be helpful.  ALZHEIMER'S I am holding Aricept secondary to prolonged QT. Neurology notes state that he has aggression from his dementia. He has been taken off of his alprazolam by his neurologist per patient's daughter. I'm therefore not continuing alprazolam. Of note he has been started on Neudexta which is not available on formulary here. Patient's daughter can bring this in for approval if necessary.   DVT prophylaxis: Lovenox Code Status: DO NOT RESUSCITATE per patient's daughter St. ClairDawn Humphrey. She notes that the family has discussed this and given his continuing decline this is the best course of  action. Family Communication: Patient's daughter Lavonda JumboDawn Humphrey was in the room and participated in the history and plan discussion. Disposition Plan: Patient may need to be discharged to a rehabilitation facility versus to his assisted living facility after completion of therapy. Consults called: None Admission status: Observation on MedSurg   Pieter PartridgeSrobona Tublu Chatterjee MD [facility] Pager [phone]  If 7PM-7AM, please contact night-coverage [email or web address] Password [email or web address]  12/10/2016, 2:42 PM

## 2016-12-10 NOTE — ED Notes (Signed)
Call for report but nurse in patient room. She will call me back.

## 2016-12-10 NOTE — ED Notes (Signed)
Bed: [id number] Expected date: 12/10/16 Expected time: 9:07 AM Means of arrival: Ambulance Comments: AMS from [facility]

## 2016-12-11 DIAGNOSIS — R32 Unspecified urinary incontinence: Secondary | ICD-10-CM | POA: Diagnosis present

## 2016-12-11 DIAGNOSIS — Z66 Do not resuscitate: Secondary | ICD-10-CM | POA: Diagnosis present

## 2016-12-11 DIAGNOSIS — F0391 Unspecified dementia with behavioral disturbance: Secondary | ICD-10-CM | POA: Diagnosis not present

## 2016-12-11 DIAGNOSIS — G9341 Metabolic encephalopathy: Secondary | ICD-10-CM | POA: Diagnosis present

## 2016-12-11 DIAGNOSIS — I10 Essential (primary) hypertension: Secondary | ICD-10-CM | POA: Diagnosis present

## 2016-12-11 DIAGNOSIS — B964 Proteus (mirabilis) (morganii) as the cause of diseases classified elsewhere: Secondary | ICD-10-CM | POA: Diagnosis present

## 2016-12-11 DIAGNOSIS — E785 Hyperlipidemia, unspecified: Secondary | ICD-10-CM | POA: Diagnosis present

## 2016-12-11 DIAGNOSIS — E860 Dehydration: Secondary | ICD-10-CM | POA: Diagnosis present

## 2016-12-11 DIAGNOSIS — G9340 Encephalopathy, unspecified: Secondary | ICD-10-CM | POA: Diagnosis not present

## 2016-12-11 DIAGNOSIS — I4581 Long QT syndrome: Secondary | ICD-10-CM | POA: Diagnosis present

## 2016-12-11 DIAGNOSIS — R319 Hematuria, unspecified: Secondary | ICD-10-CM | POA: Diagnosis not present

## 2016-12-11 DIAGNOSIS — Z87440 Personal history of urinary (tract) infections: Secondary | ICD-10-CM | POA: Diagnosis not present

## 2016-12-11 DIAGNOSIS — N179 Acute kidney failure, unspecified: Secondary | ICD-10-CM

## 2016-12-11 DIAGNOSIS — E440 Moderate protein-calorie malnutrition: Secondary | ICD-10-CM | POA: Diagnosis present

## 2016-12-11 DIAGNOSIS — G9389 Other specified disorders of brain: Secondary | ICD-10-CM | POA: Diagnosis present

## 2016-12-11 DIAGNOSIS — G309 Alzheimer's disease, unspecified: Secondary | ICD-10-CM | POA: Diagnosis present

## 2016-12-11 DIAGNOSIS — F0281 Dementia in other diseases classified elsewhere with behavioral disturbance: Secondary | ICD-10-CM | POA: Diagnosis present

## 2016-12-11 DIAGNOSIS — Z79899 Other long term (current) drug therapy: Secondary | ICD-10-CM | POA: Diagnosis not present

## 2016-12-11 DIAGNOSIS — Z7982 Long term (current) use of aspirin: Secondary | ICD-10-CM | POA: Diagnosis not present

## 2016-12-11 DIAGNOSIS — N390 Urinary tract infection, site not specified: Principal | ICD-10-CM

## 2016-12-11 DIAGNOSIS — R9431 Abnormal electrocardiogram [ECG] [EKG]: Secondary | ICD-10-CM | POA: Diagnosis not present

## 2016-12-11 DIAGNOSIS — I447 Left bundle-branch block, unspecified: Secondary | ICD-10-CM | POA: Diagnosis present

## 2016-12-11 DIAGNOSIS — R2681 Unsteadiness on feet: Secondary | ICD-10-CM | POA: Diagnosis present

## 2016-12-11 DIAGNOSIS — R400 Somnolence: Secondary | ICD-10-CM | POA: Diagnosis present

## 2016-12-11 LAB — BASIC METABOLIC PANEL
ANION GAP: 8 (ref 5–15)
BUN: 35 mg/dL — ABNORMAL HIGH (ref 6–20)
CO2: 23 mmol/L (ref 22–32)
Calcium: 9.7 mg/dL (ref 8.9–10.3)
Chloride: 113 mmol/L — ABNORMAL HIGH (ref 101–111)
Creatinine, Ser: 1.21 mg/dL (ref 0.61–1.24)
GFR, EST NON AFRICAN AMERICAN: 54 mL/min — AB (ref >60)
GLUCOSE: 103 mg/dL — AB (ref 65–99)
Potassium: 4.3 mmol/L (ref 3.5–5.1)
Sodium: 144 mmol/L (ref 135–145)

## 2016-12-11 LAB — CBC
HEMATOCRIT: 37.9 % — AB (ref 39.0–52.0)
HEMOGLOBIN: 12.2 g/dL — AB (ref 13.0–17.0)
MCH: 32.2 pg (ref 26.0–34.0)
MCHC: 32.2 g/dL (ref 30.0–36.0)
MCV: 100 fL (ref 78.0–100.0)
Platelets: 197 10*3/uL (ref 150–400)
RBC: 3.79 MIL/uL — AB (ref 4.22–5.81)
RDW: 14.1 % (ref 11.5–15.5)
WBC: 10.3 10*3/uL (ref 4.0–10.5)

## 2016-12-11 MED ORDER — SODIUM CHLORIDE 0.9 % IV SOLN
INTRAVENOUS | Status: AC
  Administered 2016-12-11 – 2016-12-12 (×2): via INTRAVENOUS

## 2016-12-11 MED ORDER — DONEPEZIL HCL 10 MG PO TABS
10.0000 mg | ORAL_TABLET | Freq: Every day | ORAL | Status: DC
  Administered 2016-12-11 – 2016-12-13 (×3): 10 mg via ORAL
  Filled 2016-12-11 (×3): qty 1

## 2016-12-11 NOTE — Progress Notes (Signed)
PROGRESS NOTE  Walter RhodesJames Williamson [id number] DOB: 01/29/1932 DOA: 12/10/2016 PCP: Malka SoJobe, Walter B., MD  Brief History:  83 year old male with a history of Alzheimer's dementia, essential hypertension, hyperlipidemia presenting with 3 day history of increasing lethargy at his assisted living facility. In addition, the patient has been noted to be more aggressive than his usual baseline when he is awake. The patient is I'm providing significant history secondary to his history of dementia. There are no reports of fevers, vomiting, headache, as per distress, diarrhea. On presentation, the patient was afebrile and hemodynamically stable and saturating on 98% on room air. WBC was 12.0 with serum creatinine at 1.31. Urinalysis showed TNTC WBC.  The patient was started on intravenous fluids and ceftriaxone empirically.  Assessment/Plan: Acute metabolic encephalopathy -Multifactorial including UTI, AKI, and mild hypercalcemia -Patient remains confused although more alert  UTI -12/10/2016 UA--TNTC WBC -Continue ceftriaxone pending urine culture data  AKI -Due to volume depletion -Baseline creatinine 0.7-0.8 -Serum creatinine. 1.31 -Continue IV fluids  Hypercalcemia -Mild  -Likely secondary to immobility -corrected calcium 10.4 -continue IVF  Alzheimer's dementia with behavioral disturbance -Restart Aricept -QTc unchanged from 08/28/16 EKG when he was on aricept -Seroquel on hold secondary to prolonged QTC -daughter can bring in Neudexta  Essential Hypertension -continue home dose metoprolol  QTc prolongation -chronic and stable -08/28/16 EKG QTc = 463ms while on taking aricept  Abnormal CT brain -Noted increased ventriculomegaly compared to 01/18/2011 -Likely secondary to increased brain atrophy and setting of dementia -No additional workup at this time  Disposition Plan:   ALF in 1-2 days  Family Communication:  Daughter updated--Total time spent 35 minutes.  Greater than  50% spent face to face counseling and coordinating care.   Consultants:  none  Code Status:   DNR  DVT Prophylaxis:   [place] Lovenox   Procedures: As Listed in Progress Note Above  Antibiotics: None    Subjective: Patient denies fevers, chills, headache, chest pain, dyspnea, nausea, vomiting, diarrhea, abdominal pain, dysuria.  He is pleasantly confused. Remainder review of systems unobtainable secondary to the patient's dementia   Objective: Vitals:   12/10/16 1512 12/10/16 1700 12/10/16 2046 12/11/16 0528  BP: 125/68  (!) 146/78 (!) 132/57  Pulse: 74  75 63  Resp: 16  16 16   Temp: 98.6 F (37 C)  98.6 F (37 C) 98.8 F (37.1 C)  TempSrc: Oral  Oral Oral  SpO2: 98%  99% 95%  Weight:  61.2 kg (135 lb)    Height: 5\' 5"  (1.651 m) 5\' 5"  (1.651 m)     No intake or output data in the 24 hours ending 12/11/16 0812 Weight change:  Exam:   General:  Pt is alert, follows commands appropriately, not in acute distress  HEENT: No icterus, No thrush, No neck mass, [place]/AT  Cardiovascular: RRR, S1/S2, no rubs, no gallops  Respiratory: Diminished breath sounds but clear to auscultation at the bases  Abdomen: Soft/+BS, non tender, non distended, no guarding  Extremities: No edema, No lymphangitis, No petechiae, No rashes, no synovitis   Data Reviewed: I have personally reviewed following labs and imaging studies Basic Metabolic Panel:  Recent Labs Lab 12/10/16 1041 12/11/16 0517  NA 145 144  K 4.7 4.3  CL 111 113*  CO2 26 23  GLUCOSE 110* 103*  BUN 41* 35*  CREATININE 1.31* 1.21  CALCIUM 10.0 9.7   Liver Function Tests:  Recent Labs Lab 12/10/16 1041  AST 24  ALT 17  ALKPHOS 62  BILITOT 1.0  PROT 6.8  ALBUMIN 3.5   No results for input(s): LIPASE, AMYLASE in the last 168 hours. No results for input(s): AMMONIA in the last 168 hours. Coagulation Profile: No results for input(s): INR, PROTIME in the last 168 hours. CBC:  Recent Labs Lab  12/10/16 1041 12/11/16 0517  WBC 12.0* 10.3  NEUTROABS 9.3*  --   HGB 13.1 12.2*  HCT 40.1 37.9*  MCV 101.5* 100.0  PLT 213 197   Cardiac Enzymes: No results for input(s): CKTOTAL, CKMB, CKMBINDEX, TROPONINI in the last 168 hours. BNP: Invalid input(s): POCBNP CBG: No results for input(s): GLUCAP in the last 168 hours. HbA1C: No results for input(s): HGBA1C in the last 72 hours. Urine analysis:    Component Value Date/Time   COLORURINE YELLOW 12/10/2016 1204   APPEARANCEUR CLOUDY (A) 12/10/2016 1204   LABSPEC 1.013 12/10/2016 1204   PHURINE 8.0 12/10/2016 1204   GLUCOSEU NEGATIVE 12/10/2016 1204   HGBUR NEGATIVE 12/10/2016 1204   BILIRUBINUR NEGATIVE 12/10/2016 1204   KETONESUR NEGATIVE 12/10/2016 1204   PROTEINUR 100 (A) 12/10/2016 1204   NITRITE NEGATIVE 12/10/2016 1204   LEUKOCYTESUR LARGE (A) 12/10/2016 1204   Sepsis Labs: @LABRCNTIP (procalcitonin:4,lacticidven:4) ) Recent Results (from the past 240 hour(s))  MRSA PCR Screening     Status: None   Collection Time: 12/10/16  5:32 PM  Result Value Ref Range Status   MRSA by PCR NEGATIVE NEGATIVE Final    Comment:        The GeneXpert MRSA Assay (FDA approved for NASAL specimens only), is one component of a comprehensive MRSA colonization surveillance program. It is not intended to diagnose MRSA infection nor to guide or monitor treatment for MRSA infections.      Scheduled Meds: . aspirin  81 mg Oral Daily  . enoxaparin (LOVENOX) injection  40 mg Subcutaneous Q24H  . feeding supplement (ENSURE ENLIVE)  237 mL Oral BID BM  . finasteride  5 mg Oral Daily  . metoprolol tartrate  12.5 mg Oral BID  . simvastatin  40 mg Oral Daily   Continuous Infusions: . sodium chloride 100 mL/hr at 12/10/16 1717  . cefTRIAXone (ROCEPHIN)  IV      Procedures/Studies: Dg Chest 2 View  Result Date: 12/10/2016 CLINICAL DATA:  83 year old male with increase weakness and altered mental status over the last 2 days EXAM:  CHEST  2 VIEW COMPARISON:  Prior chest x-ray 08/30/2016 FINDINGS: The lungs are clear and negative for focal airspace consolidation, pulmonary edema or suspicious pulmonary nodule. Mild pulmonary vascular congestion without overt edema. No pleural effusion or pneumothorax. Cardiac and mediastinal contours are within normal limits. Trace atherosclerotic calcifications in the transverse aorta. No acute fracture or lytic or blastic osseous lesions. The visualized upper abdominal bowel gas pattern is unremarkable. IMPRESSION: 1. Minimal pulmonary vascular congestion without overt edema. 2.  Aortic Atherosclerosis (ICD10-170.0) Electronically Signed   By: Malachy Moan M.D.   On: 12/10/2016 10:57   Ct Head Wo Contrast  Result Date: 12/10/2016 CLINICAL DATA:  83 year old male with acute altered mental status 2 days ago. EXAM: CT HEAD WITHOUT CONTRAST TECHNIQUE: Contiguous axial images were obtained from the base of the skull through the vertex without intravenous contrast. COMPARISON:  01/18/2011 MR FINDINGS: Brain: Atrophy and chronic small-vessel white matter ischemic changes again noted. Ventriculomegaly has increased since 01/18/2011 - communicating hydrocephalus is not excluded. There is no evidence of acute infarction, hemorrhage, midline shift, mass effect or extra-axial collection. Vascular:  Intracranial atherosclerotic calcifications noted. Skull: Normal. Negative for fracture or focal lesion. Sinuses/Orbits: No acute finding. Opacified right sphenoid sinus again noted. Other: None IMPRESSION: Increased ventriculomegaly since 01/18/2011 - communicating hydrocephalus not excluded. No other acute abnormality identified. Atrophy and chronic small-vessel white matter ischemic changes. Electronically Signed   By: Harmon PierJeffrey  Hu M.D.   On: 12/10/2016 10:24    Rakeb Kibble, DO  [facility] Pager [phone]  If 7PM-7AM, please contact night-coverage [email or web address] Password [email or web address] 12/11/2016, 8:12 AM    LOS: 0 days

## 2016-12-12 DIAGNOSIS — G9340 Encephalopathy, unspecified: Secondary | ICD-10-CM

## 2016-12-12 DIAGNOSIS — F03918 Unspecified dementia, unspecified severity, with other behavioral disturbance: Secondary | ICD-10-CM

## 2016-12-12 DIAGNOSIS — F0281 Dementia in other diseases classified elsewhere with behavioral disturbance: Secondary | ICD-10-CM

## 2016-12-12 DIAGNOSIS — F0391 Unspecified dementia with behavioral disturbance: Secondary | ICD-10-CM

## 2016-12-12 LAB — BASIC METABOLIC PANEL
Anion gap: 9 (ref 5–15)
BUN: 25 mg/dL — ABNORMAL HIGH (ref 6–20)
CHLORIDE: 107 mmol/L (ref 101–111)
CO2: 23 mmol/L (ref 22–32)
Calcium: 9.3 mg/dL (ref 8.9–10.3)
Creatinine, Ser: 1.21 mg/dL (ref 0.61–1.24)
GFR calc non Af Amer: 54 mL/min — ABNORMAL LOW (ref >60)
Glucose, Bld: 101 mg/dL — ABNORMAL HIGH (ref 65–99)
POTASSIUM: 4.6 mmol/L (ref 3.5–5.1)
SODIUM: 139 mmol/L (ref 135–145)

## 2016-12-12 LAB — CBC
HEMATOCRIT: 37 % — AB (ref 39.0–52.0)
Hemoglobin: 12.2 g/dL — ABNORMAL LOW (ref 13.0–17.0)
MCH: 33.2 pg (ref 26.0–34.0)
MCHC: 33 g/dL (ref 30.0–36.0)
MCV: 100.8 fL — AB (ref 78.0–100.0)
PLATELETS: 212 10*3/uL (ref 150–400)
RBC: 3.67 MIL/uL — AB (ref 4.22–5.81)
RDW: 13.9 % (ref 11.5–15.5)
WBC: 10 10*3/uL (ref 4.0–10.5)

## 2016-12-12 MED ORDER — SODIUM CHLORIDE 0.9 % IV SOLN
INTRAVENOUS | Status: AC
  Administered 2016-12-12: 18:00:00 via INTRAVENOUS

## 2016-12-12 MED ORDER — DEXTROMETHORPHAN-QUINIDINE 20-10 MG PO CAPS
1.0000 | ORAL_CAPSULE | Freq: Two times a day (BID) | ORAL | Status: DC
  Administered 2016-12-12 – 2016-12-14 (×4): 1 via ORAL
  Filled 2016-12-12 (×5): qty 1

## 2016-12-12 MED ORDER — MEMANTINE HCL 10 MG PO TABS
10.0000 mg | ORAL_TABLET | Freq: Two times a day (BID) | ORAL | Status: DC
  Administered 2016-12-12 – 2016-12-14 (×4): 10 mg via ORAL
  Filled 2016-12-12 (×4): qty 1

## 2016-12-12 NOTE — Progress Notes (Signed)
PROGRESS NOTE  Dee Maday ZOX:[id number] DOB: 26-Jun-1931 DOA: 12/10/2016 PCP: Malka So., MD  Brief History:  83 year old male with a history of Alzheimer's dementia, essential hypertension, hyperlipidemia presenting with 3 day history of increasing lethargy at his assisted living facility. In addition, the patient has been noted to be more aggressive than his usual baseline when he is awake. The patient is I'm providing significant history secondary to his history of dementia. There are no reports of fevers, vomiting, headache, as per distress, diarrhea. On presentation, the patient was afebrile and hemodynamically stable and saturating on 98% on room air. WBC was 12.0 with serum creatinine at 1.31. Urinalysis showed TNTC WBC.  The patient was started on intravenous fluids and ceftriaxone empirically.  Assessment/Plan: Acute metabolic encephalopathy -Multifactorial including UTI, AKI, and mild hypercalcemia -Patient remains confused although more alert -check TSH -check B12  UTI -12/10/2016 UA--TNTC WBC -proteus mirabilis -Continue ceftriaxone pending urine culture data  AKI -Due to volume depletion -Baseline creatinine 0.7-0.8 -Serum creatinine. 1.31 at time of admission -Continue IV fluids--increase rate  Hypercalcemia -Mild  -Likely secondary to immobility -corrected calcium 10.4 -continue IVF-->improved  Alzheimer's dementia with behavioral disturbance -Restart Aricept and namenda -QTc unchanged from 08/28/16 EKG when he was on aricept -Seroquel on hold secondary to prolonged QTC -daughter brought in Neudexta  Essential Hypertension -continue home dose metoprolol  QTc prolongation -chronic and stable -08/28/16 EKG QTc = while on taking aricept  Abnormal CT brain -Noted increased ventriculomegaly compared to 01/18/2011 -Likely secondary to increased brain atrophy and setting of dementia -No additional workup at this time  Disposition  Plan:   ALF vs SNF 12/13/16 if stable Family Communication:  Daughter updated 8/6   Consultants:  none  Code Status:   DNR  DVT Prophylaxis:    Lovenox   Procedures: As Listed in Progress Note Above  Antibiotics: None   Subjective: Patient remains pleasantly confused although alert. He intermittently answers questions. He intermittently follows one-step commands. No reports of vomiting, diarrhea, uncontrolled pain, joint distress.  Objective: Vitals:   12/11/16 2058 12/12/16 0622 12/12/16 0943 12/12/16 1452  BP: 121/69 131/69 138/73 (!) 145/74  Pulse: 94 64 73 73  Resp: 17 18  20   Temp: 97.6 F (36.4 C) 97.6 F (36.4 C)  (!) 97.2 F (36.2 C)  TempSrc: Axillary Oral  Oral  SpO2: 97% 97%  97%  Weight:      Height:        Intake/Output Summary (Last 24 hours) at 12/12/16 1805 Last data filed at 12/12/16 1449  Gross per 24 hour  Intake             1320 ml  Output             1000 ml  Net              320 ml   Weight change: -3.221 kg (-7 lb 1.6 oz) Exam:   General:  Pt is alert, follows commands appropriately, not in acute distress  HEENT: No icterus, No thrush, No neck mass, Crucible/AT  Cardiovascular: RRR, S1/S2, no rubs, no gallops  Respiratory: Bibasilar rales without wheezing. Good air movement.  Abdomen: Soft/+BS, non tender, non distended, no guarding  Extremities: No edema, No lymphangitis, No petechiae, No rashes, no synovitis   Data Reviewed: I have personally reviewed following labs and imaging studies Basic Metabolic Panel:  Recent Labs Lab 12/10/16 1041 12/11/16 0517 12/12/16 8119  NA 145 144 139  K 4.7 4.3 4.6  CL 111 113* 107  CO2 26 23 23   GLUCOSE 110* 103* 101*  BUN 41* 35* 25*  CREATININE 1.31* 1.21 1.21  CALCIUM 10.0 9.7 9.3   Liver Function Tests:  Recent Labs Lab 12/10/16 1041  AST 24  ALT 17  ALKPHOS 62  BILITOT 1.0  PROT 6.8  ALBUMIN 3.5   No results for input(s): LIPASE, AMYLASE in the last 168 hours. No  results for input(s): AMMONIA in the last 168 hours. Coagulation Profile: No results for input(s): INR, PROTIME in the last 168 hours. CBC:  Recent Labs Lab 12/10/16 1041 12/11/16 0517 12/12/16 0519  WBC 12.0* 10.3 10.0  NEUTROABS 9.3*  --   --   HGB 13.1 12.2* 12.2*  HCT 40.1 37.9* 37.0*  MCV 101.5* 100.0 100.8*  PLT 213 197 212   Cardiac Enzymes: No results for input(s): CKTOTAL, CKMB, CKMBINDEX, TROPONINI in the last 168 hours. BNP: Invalid input(s): POCBNP CBG: No results for input(s): GLUCAP in the last 168 hours. HbA1C: No results for input(s): HGBA1C in the last 72 hours. Urine analysis:    Component Value Date/Time   COLORURINE YELLOW 12/10/2016 1204   APPEARANCEUR CLOUDY (A) 12/10/2016 1204   LABSPEC 1.013 12/10/2016 1204   PHURINE 8.0 12/10/2016 1204   GLUCOSEU NEGATIVE 12/10/2016 1204   HGBUR NEGATIVE 12/10/2016 1204   BILIRUBINUR NEGATIVE 12/10/2016 1204   KETONESUR NEGATIVE 12/10/2016 1204   PROTEINUR 100 (A) 12/10/2016 1204   NITRITE NEGATIVE 12/10/2016 1204   LEUKOCYTESUR LARGE (A) 12/10/2016 1204   Sepsis Labs: @LABRCNTIP (procalcitonin:4,lacticidven:4) ) Recent Results (from the past 240 hour(s))  Urine culture     Status: Abnormal (Preliminary result)   Collection Time: 12/10/16 12:04 PM  Result Value Ref Range Status   Specimen Description URINE, CLEAN CATCH  Final   Special Requests NONE  Final   Culture >=100,000 COLONIES/mL PROTEUS MIRABILIS (A)  Final   Report Status PENDING  Incomplete  MRSA PCR Screening     Status: None   Collection Time: 12/10/16  5:32 PM  Result Value Ref Range Status   MRSA by PCR NEGATIVE NEGATIVE Final    Comment:        The GeneXpert MRSA Assay (FDA approved for NASAL specimens only), is one component of a comprehensive MRSA colonization surveillance program. It is not intended to diagnose MRSA infection nor to guide or monitor treatment for MRSA infections.      Scheduled Meds: . aspirin  81 mg Oral  Daily  . Dextromethorphan-Quinidine  1 capsule Oral BID  . donepezil  10 mg Oral QHS  . enoxaparin (LOVENOX) injection  40 mg Subcutaneous Q24H  . feeding supplement (ENSURE ENLIVE)  237 mL Oral BID BM  . finasteride  5 mg Oral Daily  . memantine  10 mg Oral BID  . metoprolol tartrate  12.5 mg Oral BID  . simvastatin  40 mg Oral Daily   Continuous Infusions: . sodium chloride    . cefTRIAXone (ROCEPHIN)  IV 1 g (12/12/16 1220)    Procedures/Studies: Dg Chest 2 View  Result Date: 12/10/2016 CLINICAL DATA:  83 year old male with increase weakness and altered mental status over the last 2 days EXAM: CHEST  2 VIEW COMPARISON:  Prior chest x-ray 08/30/2016 FINDINGS: The lungs are clear and negative for focal airspace consolidation, pulmonary edema or suspicious pulmonary nodule. Mild pulmonary vascular congestion without overt edema. No pleural effusion or pneumothorax. Cardiac and mediastinal contours are within  normal limits. Trace atherosclerotic calcifications in the transverse aorta. No acute fracture or lytic or blastic osseous lesions. The visualized upper abdominal bowel gas pattern is unremarkable. IMPRESSION: 1. Minimal pulmonary vascular congestion without overt edema. 2.  Aortic Atherosclerosis (ICD10-170.0) Electronically Signed   By: Malachy MoanHeath  McCullough M.D.   On: 12/10/2016 10:57   Ct Head Wo Contrast  Result Date: 12/10/2016 CLINICAL DATA:  83 year old male with acute altered mental status 2 days ago. EXAM: CT HEAD WITHOUT CONTRAST TECHNIQUE: Contiguous axial images were obtained from the base of the skull through the vertex without intravenous contrast. COMPARISON:  01/18/2011 MR FINDINGS: Brain: Atrophy and chronic small-vessel white matter ischemic changes again noted. Ventriculomegaly has increased since 01/18/2011 - communicating hydrocephalus is not excluded. There is no evidence of acute infarction, hemorrhage, midline shift, mass effect or extra-axial collection. Vascular:  Intracranial atherosclerotic calcifications noted. Skull: Normal. Negative for fracture or focal lesion. Sinuses/Orbits: No acute finding. Opacified right sphenoid sinus again noted. Other: None IMPRESSION: Increased ventriculomegaly since 01/18/2011 - communicating hydrocephalus not excluded. No other acute abnormality identified. Atrophy and chronic small-vessel white matter ischemic changes. Electronically Signed   By: Harmon PierJeffrey  Hu M.D.   On: 12/10/2016 10:24    Mehkai Gallo, DO  [facility] Pager [phone]  If 7PM-7AM, please contact night-coverage [email or web address] Password [email or web address] 12/12/2016, 6:05 PM   LOS: 1 day

## 2016-12-12 NOTE — Progress Notes (Signed)
Initial Nutrition Assessment  DOCUMENTATION CODES:   Non-severe (moderate) malnutrition in context of chronic illness  INTERVENTION:   Ensure Enlive po BID, each supplement provides 350 kcal and 20 grams of protein  NUTRITION DIAGNOSIS:   Malnutrition (Moderate) related to chronic illness (alzheimer's disease) as evidenced by moderate depletions of muscle mass and moderate depletion of body fat.  GOAL:   Patient will meet greater than or equal to 90% of their needs  MONITOR:   PO intake, Supplement acceptance, Labs, Weight trends  REASON FOR ASSESSMENT:   Malnutrition Screening Tool  ASSESSMENT:   Pt with PMH of alzheimer's disease and recurrent UTIs. Presents this admission with altered mental status with increased aggression.   Pt reports having a great appetite prior to admission and during this admission. Pt comes from SNF, where he consumes 3 meals daily. Despite pt reports, suspect pt has not been meeting estimated energy needs for prolonged period. Per records, PO intake adequate and progressing at 65% for the last three meals. Will provide supplementation as pt likes to drink them.   Wt history limited in chart, but shows that pt weighed 139 lb in March. This shows a wt loss of 9% in 4 months to current wt of 127 lb. This is not significant.   Nutrition-Focused physical exam completed. Findings are moderate fat depletion in the upper arm and thoracic/lumbar regions, moderate muscle depletion in the calf, clavicle, scapular regions, severe muscle depletions in the thigh patellar, temple regions, and no edema.   Given pt's severe to moderate fat and muscle depletions, this pt meets criteria for moderate malnutrition. Pt is at high risk for severe malnutrition if wt loss persists.  Medications reviewed and include: NS @ 125 ml/hr, IV abx Labs reviewed: BUN 25 (H), CBG 101-110   Diet Order:  DIET SOFT Room service appropriate? Yes; Fluid consistency: Thin  Skin:   Reviewed, no issues  Last BM:  PTA  Height:   Ht Readings from Last 1 Encounters:  12/10/16 5\' 5"  (1.651 m)    Weight:   Wt Readings from Last 1 Encounters:  12/11/16 127 lb 14.4 oz (58 kg)    Ideal Body Weight:  61.8 kg  BMI:  Body mass index is 21.28 kg/m.  Estimated Nutritional Needs:   Kcal:  1750-1950 (30-34 kcal/kg)  Protein:  90-100 grams (1.6-1.7 g/kg)  Fluid:  > 1.7 L/day  EDUCATION NEEDS:   No education needs identified at this time  Vanessa Kickarly Luvada Salamone RD, LDN Clinical Nutrition Pager # - [phone]

## 2016-12-12 NOTE — Care Management Note (Signed)
Case Management Note  Patient Details  Name: Terrial RhodesJames France MRN: [id number] Date of Birth: [birth year]  Subjective/Objective:    AMS                Action/Plan: Date:  December 12, 2016 Chart reviewed for concurrent status and case management needs. Will continue to follow patient progress. Discharge Planning: following for needs/Lives at the ALF at [facility]/Dtg is support contact. Expected discharge date: [id number]/07/2016 Marcelle SmilingRhonda Eevee Borbon, BSN, [place], [place]   [phone]  Expected Discharge Date:  12/13/16               Expected Discharge Plan:  Assisted Living / Rest Home  In-House Referral:  Clinical Social Work  Discharge planning Services  CM Consult  Post Acute Care Choice:    Choice offered to:     DME Arranged:    DME Agency:     HH Arranged:    HH Agency:     Status of Service:  In process, will continue to follow  If discussed at Long Length of Stay Meetings, dates discussed:    Additional Comments:  Golda AcreDavis, Rabiah Goeser Lynn, RN 12/12/2016, 10:18 AM

## 2016-12-13 DIAGNOSIS — R9431 Abnormal electrocardiogram [ECG] [EKG]: Secondary | ICD-10-CM

## 2016-12-13 LAB — BASIC METABOLIC PANEL
ANION GAP: 5 (ref 5–15)
BUN: 23 mg/dL — AB (ref 6–20)
CHLORIDE: 111 mmol/L (ref 101–111)
CO2: 22 mmol/L (ref 22–32)
Calcium: 9.1 mg/dL (ref 8.9–10.3)
Creatinine, Ser: 1 mg/dL (ref 0.61–1.24)
GFR calc Af Amer: >60 mL/min (ref >60)
Glucose, Bld: 99 mg/dL (ref 65–99)
POTASSIUM: 3.8 mmol/L (ref 3.5–5.1)
SODIUM: 138 mmol/L (ref 135–145)

## 2016-12-13 LAB — CBC
HEMATOCRIT: 35 % — AB (ref 39.0–52.0)
HEMOGLOBIN: 11.5 g/dL — AB (ref 13.0–17.0)
MCH: 32.7 pg (ref 26.0–34.0)
MCHC: 32.9 g/dL (ref 30.0–36.0)
MCV: 99.4 fL (ref 78.0–100.0)
Platelets: 177 10*3/uL (ref 150–400)
RBC: 3.52 MIL/uL — AB (ref 4.22–5.81)
RDW: 13.8 % (ref 11.5–15.5)
WBC: 8.8 10*3/uL (ref 4.0–10.5)

## 2016-12-13 LAB — VITAMIN B12: VITAMIN B 12: 562 pg/mL (ref 180–914)

## 2016-12-13 LAB — TSH: TSH: 0.816 u[IU]/mL (ref 0.350–4.500)

## 2016-12-13 MED ORDER — ENSURE ENLIVE PO LIQD
237.0000 mL | Freq: Two times a day (BID) | ORAL | 0 refills | Status: AC
Start: 2016-12-14 — End: ?

## 2016-12-13 MED ORDER — CEFUROXIME AXETIL 500 MG PO TABS: 500.0000 mg | ORAL_TABLET | Freq: Two times a day (BID) | ORAL | 0 refills | Status: AC

## 2016-12-13 MED ORDER — CEFUROXIME AXETIL 500 MG PO TABS
500.0000 mg | ORAL_TABLET | Freq: Two times a day (BID) | ORAL | Status: DC
  Administered 2016-12-14 (×2): 500 mg via ORAL
  Filled 2016-12-13 (×2): qty 1

## 2016-12-13 MED ORDER — SIMVASTATIN 40 MG PO TABS: 40.0000 mg | ORAL_TABLET | Freq: Every day | ORAL | 0 refills | Status: AC

## 2016-12-13 NOTE — NC FL2 (Signed)
[place] MEDICAID FL2 LEVEL OF CARE SCREENING TOOL     IDENTIFICATION  Patient Name: Walter Williamson Birthdate: November 03, 1931 Sex: male Admission Date (Current Location): 12/10/2016  [facility] [place]   Facility and Address:  [facility],  [address], [place] [id number]      Provider Number: [id number]  Attending Physician Name and Address:  Catarina Hartshornat, David, MD  Relative Name and Phone Number:    Lavonda JumboHumphrey,Dawn [phone]    Current Level of Care: Hospital Recommended Level of Care: Skilled Nursing Facility Prior Approval Number:    Date Approved/Denied:   PASRR Number: [id number]  Discharge Plan: SNF    Current Diagnoses: Patient Active Problem List   Diagnosis Date Noted  . Dementia with behavioral disturbance   . Hypercalcemia 12/11/2016  . Acute kidney failure, unspecified (HCC)   . Metabolic encephalopathy 12/10/2016  . Acute encephalopathy 12/10/2016  . QT prolongation 12/10/2016  . Abnormal CT of brain 12/10/2016  . Acute respiratory failure with hypoxia (HCC)   . Urinary tract infection, site not specified 08/28/2016  . Severe sepsis with septic shock (CODE) (HCC) 08/28/2016  . Dementia, unspecified, with behavioral disturbance 08/28/2016  . AKI (acute kidney injury) (HCC) 08/28/2016    Orientation RESPIRATION BLADDER Height & Weight     Self  Normal Incontinent, External catheter Weight: 127 lb 14.4 oz (58 kg) Height:  5\' 5"  (165.1 cm)  BEHAVIORAL SYMPTOMS/MOOD NEUROLOGICAL BOWEL NUTRITION STATUS   No Behaviors   Continent Diet (SOFT Diet)  AMBULATORY STATUS COMMUNICATION OF NEEDS Skin   Extensive Assist Verbally Normal                       Personal Care Assistance Level of Assistance  Bathing, Feeding, Dressing Bathing Assistance: Limited assistance Feeding assistance: Independent Dressing Assistance: Limited assistance     Functional Limitations Info  Sight, Hearing, Speech Sight Info:  Adequate Hearing Info: Impaired Speech Info: Adequate    SPECIAL CARE FACTORS FREQUENCY  PT (By licensed PT)    2X/WEEK                Contractures Contractures Info: Not present    Additional Factors Info  Code Status Code Status Info: DNR  Allergies Info: No Known Allergies           Current Medications (12/13/2016):  This is the current hospital active medication list Current Facility-Administered Medications  Medication Dose Route Frequency Provider Last Rate Last Dose  . 0.9 %  sodium chloride infusion   Intravenous Continuous Tat, David, MD 125 mL/hr at 12/12/16 1823    . acetaminophen (TYLENOL) tablet 650 mg  650 mg Oral Q6H PRN Pieter Partridgehatterjee, Srobona Tublu, MD   650 mg at 12/10/16 2312  . aspirin chewable tablet 81 mg  81 mg Oral Daily Leandro Reasonerhatterjee, Srobona Tublu, MD   81 mg at 12/12/16 0943  . bisacodyl (DULCOLAX) suppository 10 mg  10 mg Rectal Daily PRN Leandro Reasonerhatterjee, Srobona Tublu, MD      . cefTRIAXone (ROCEPHIN) 1 g in dextrose 5 % 50 mL IVPB  1 g Intravenous Q24H Pieter Partridgehatterjee, Srobona Tublu, MD   Stopped at 12/12/16 1300  . Dextromethorphan-Quinidine 20-10 MG CAPS 1 capsule  1 capsule Oral BID Catarina Hartshornat, David, MD   1 capsule at 12/12/16 2217  . donepezil (ARICEPT) tablet 10 mg  10 mg Oral Maura CrandallQHS Tat, David, MD   10 mg at 12/12/16 2217  . enoxaparin (LOVENOX) injection 40 mg  40  mg Subcutaneous Q24H Leandro Reasoner Tublu, MD   40 mg at 12/12/16 2217  . feeding supplement (ENSURE ENLIVE) (ENSURE ENLIVE) liquid 237 mL  237 mL Oral BID BM Leandro Reasoner Tublu, MD   237 mL at 12/12/16 1823  . finasteride (PROSCAR) tablet 5 mg  5 mg Oral Daily Leandro Reasoner Tublu, MD   5 mg at 12/12/16 0945  . memantine (NAMENDA) tablet 10 mg  10 mg Oral BID Catarina Hartshorn, MD   10 mg at 12/12/16 2217  . metoprolol tartrate (LOPRESSOR) tablet 12.5 mg  12.5 mg Oral BID Leandro Reasoner Tublu, MD   12.5 mg at 12/12/16 2217  . senna-docusate (Senokot-S) tablet 1 tablet  1 tablet Oral QHS PRN  Leandro Reasoner Tublu, MD      . simvastatin (ZOCOR) tablet 40 mg  40 mg Oral Daily Leandro Reasoner Tublu, MD   40 mg at 12/12/16 4098     Discharge Medications: Please see discharge summary for a list of discharge medications.  Relevant Imaging Results:  Relevant Lab Results:   Additional Information SS# [id number]  Clearance Coots, LCSW

## 2016-12-13 NOTE — Progress Notes (Signed)
Paged Dr Tat that patient's daughter wants to talk to him.

## 2016-12-13 NOTE — Progress Notes (Signed)
CSW spoke to patient daughter about SNF placement and inform her of [facility] bed offer. She is concern about patient H&P indicating pt. Medical history of behaviors. She requested to speak to physician and reported she talk with Director on [address]. [facility] but prefers other options that have not extended offers. CSW will continue to assist with placement.   Vivi BarrackNicole Winnell Bento, Theresia MajorsLCSWA, MSW Clinical Social Worker 5E and Psychiatric Service Line [phone] 12/13/2016  4:43 PM

## 2016-12-13 NOTE — Discharge Summary (Signed)
Physician Discharge Summary  Walter RhodesJames Vannostrand OZH:[id number] DOB: Jan 23, 1932 DOA: 12/10/2016  PCP: Malka SoJobe, Daniel B., MD  Admit date: 12/10/2016 Discharge date: 12/14/16  Admitted From: [facility] Disposition:   SNF  Recommendations for Outpatient Follow-up:  1. Follow up with PCP in 1-2 weeks 2. Please obtain BMP/CBC in one week     Discharge Condition: Stable CODE STATUS:DNR Diet recommendation: Soft   Brief/Interim Summary: 83 year old male with a history of Alzheimer's dementia, essential hypertension, hyperlipidemia presenting with 3 day history of increasing lethargy at his assisted living facility. In addition, the patient has been noted to be more aggressive than his usual baseline when he is awake. The patient is I'm providing significant history secondary to his history of dementia. There are no reports of fevers, vomiting, headache, as per distress, diarrhea. On presentation, the patient was afebrile and hemodynamically stable and saturating on 98% on room air.WBC was 12.0 with serum creatinine at 1.31. Urinalysis showed TNTC WBC. The patient was started on intravenous fluids and ceftriaxone empirically.  Discharge Diagnoses:  Acute metabolic encephalopathy -Multifactorial including UTI, AKI, and mild hypercalcemia -Patient's mental status has improved and daughter states he is near baseline--he remains pleasantly confused -check TSH--0.816 -check B12--562  UTI--Proteus -12/10/2016 UA--TNTC WBC -proteus mirabilis--ceftriaxone started-->d/c with cefuroxime x 3 more days to complete one week tx  AKI -Due to volume depletion -Baseline creatinine 0.7-0.8 -Serum creatinine. 1.31 at time of admission -serum creatinine 1.00 at time of d/c -Continued IV fluids  Hypercalcemia -Mild  -Likely secondary to immobility -corrected calcium 10.4 -continue IVF-->improved  Alzheimer's dementia with behavioral disturbance -initial behavioral disturbance due to infectious  process -with treatment of infectious process, patient's behavior has improved and patient is presently redirectable -QTc unchanged from 08/28/16 EKG when he was on aricept and namenda -Restart Aricept and namenda -daughter states patient no longer takes seroquel -daughter broughtin Neudexta-->restart  Essential Hypertension -continue home dose metoprolol  QTc prolongation -chronic and stable -08/28/16 EKG QTc = 463ms while on taking aricept and namenda  Abnormal CT brain -Noted increased ventriculomegaly compared to 01/18/2011 -Likely secondary to increased brain atrophy and setting of dementia -No additional workup at this time  Moderate malnutrition -start supplements   Discharge Instructions   Allergies as of 12/13/2016   No Known Allergies     Medication List    STOP taking these medications   ALPRAZolam 0.5 MG tablet Commonly known as:  XANAX   QUEtiapine 25 MG tablet Commonly known as:  SEROQUEL     TAKE these medications   acetaminophen 325 MG tablet Commonly known as:  TYLENOL Take 2 tablets (650 mg total) by mouth every 6 (six) hours as needed for mild pain (or Fever >/= 101).   aspirin 81 MG chewable tablet Chew 81 mg by mouth daily.   cefUROXime 500 MG tablet Commonly known as:  CEFTIN Take 1 tablet (500 mg total) by mouth 2 (two) times daily with a meal. X 3 days What changed:  medication strength  how much to take  how to take this  when to take this  additional instructions   CENTRUM SILVER 50+MEN PO Take 1 tablet by mouth daily.   donepezil 10 MG tablet Commonly known as:  ARICEPT Take 1 tablet (10 mg total) by mouth every morning. What changed:  when to take this   feeding supplement (ENSURE ENLIVE) Liqd Take 237 mLs by mouth 2 (two) times daily between meals.   finasteride 5 MG tablet Commonly known as:  PROSCAR Take 5  mg by mouth every morning.   memantine 10 MG tablet Commonly known as:  NAMENDA Take 1 tablet (10 mg  total) by mouth 2 (two) times daily.   metoprolol tartrate 25 MG tablet Commonly known as:  LOPRESSOR Take 12.5 mg by mouth 2 (two) times daily.   NUEDEXTA 20-10 MG Caps Generic drug:  Dextromethorphan-Quinidine Take 1 capsule by mouth 2 (two) times daily.   pantoprazole 40 MG tablet Commonly known as:  PROTONIX Take 40 mg by mouth every morning.   simvastatin 40 MG tablet Commonly known as:  ZOCOR Take 1 tablet (40 mg total) by mouth daily. What changed:  when to take this       No Known Allergies  Consultations:  none   Procedures/Studies: Dg Chest 2 View  Result Date: 12/10/2016 CLINICAL DATA:  83 year old male with increase weakness and altered mental status over the last 2 days EXAM: CHEST  2 VIEW COMPARISON:  Prior chest x-ray 08/30/2016 FINDINGS: The lungs are clear and negative for focal airspace consolidation, pulmonary edema or suspicious pulmonary nodule. Mild pulmonary vascular congestion without overt edema. No pleural effusion or pneumothorax. Cardiac and mediastinal contours are within normal limits. Trace atherosclerotic calcifications in the transverse aorta. No acute fracture or lytic or blastic osseous lesions. The visualized upper abdominal bowel gas pattern is unremarkable. IMPRESSION: 1. Minimal pulmonary vascular congestion without overt edema. 2.  Aortic Atherosclerosis (ICD10-170.0) Electronically Signed   By: Malachy Moan M.D.   On: 12/10/2016 10:57   Ct Head Wo Contrast  Result Date: 12/10/2016 CLINICAL DATA:  83 year old male with acute altered mental status 2 days ago. EXAM: CT HEAD WITHOUT CONTRAST TECHNIQUE: Contiguous axial images were obtained from the base of the skull through the vertex without intravenous contrast. COMPARISON:  01/18/2011 MR FINDINGS: Brain: Atrophy and chronic small-vessel white matter ischemic changes again noted. Ventriculomegaly has increased since 01/18/2011 - communicating hydrocephalus is not excluded. There is no  evidence of acute infarction, hemorrhage, midline shift, mass effect or extra-axial collection. Vascular: Intracranial atherosclerotic calcifications noted. Skull: Normal. Negative for fracture or focal lesion. Sinuses/Orbits: No acute finding. Opacified right sphenoid sinus again noted. Other: None IMPRESSION: Increased ventriculomegaly since 01/18/2011 - communicating hydrocephalus not excluded. No other acute abnormality identified. Atrophy and chronic small-vessel white matter ischemic changes. Electronically Signed   By: Harmon Pier M.D.   On: 12/10/2016 10:24        Discharge Exam: Vitals:   12/13/16 0412 12/13/16 1400  BP: 138/84 130/66  Pulse: 74 82  Resp: 18 20  Temp: 98.5 F (36.9 C) 98.2 F (36.8 C)   Vitals:   12/12/16 1452 12/12/16 2230 12/13/16 0412 12/13/16 1400  BP: (!) 145/74 107/65 138/84 130/66  Pulse: 73 81 74 82  Resp: 20 20 18 20   Temp: (!) 97.2 F (36.2 C) 98.4 F (36.9 C) 98.5 F (36.9 C) 98.2 F (36.8 C)  TempSrc: Oral Oral Oral Oral  SpO2: 97% 93%  97%  Weight:      Height:        General: Pt is alert, awake, not in acute distress Cardiovascular: RRR, S1/S2 +, no rubs, no gallops Respiratory: CTA bilaterally, no wheezing, no rhonchi Abdominal: Soft, NT, ND, bowel sounds + Extremities: no edema, no cyanosis   The results of significant diagnostics from this hospitalization (including imaging, microbiology, ancillary and laboratory) are listed below for reference.    Significant Diagnostic Studies: Dg Chest 2 View  Result Date: 12/10/2016 CLINICAL DATA:  83 year old male with  increase weakness and altered mental status over the last 2 days EXAM: CHEST  2 VIEW COMPARISON:  Prior chest x-ray 08/30/2016 FINDINGS: The lungs are clear and negative for focal airspace consolidation, pulmonary edema or suspicious pulmonary nodule. Mild pulmonary vascular congestion without overt edema. No pleural effusion or pneumothorax. Cardiac and mediastinal contours  are within normal limits. Trace atherosclerotic calcifications in the transverse aorta. No acute fracture or lytic or blastic osseous lesions. The visualized upper abdominal bowel gas pattern is unremarkable. IMPRESSION: 1. Minimal pulmonary vascular congestion without overt edema. 2.  Aortic Atherosclerosis (ICD10-170.0) Electronically Signed   By: Malachy Moan M.D.   On: 12/10/2016 10:57   Ct Head Wo Contrast  Result Date: 12/10/2016 CLINICAL DATA:  83 year old male with acute altered mental status 2 days ago. EXAM: CT HEAD WITHOUT CONTRAST TECHNIQUE: Contiguous axial images were obtained from the base of the skull through the vertex without intravenous contrast. COMPARISON:  01/18/2011 MR FINDINGS: Brain: Atrophy and chronic small-vessel white matter ischemic changes again noted. Ventriculomegaly has increased since 01/18/2011 - communicating hydrocephalus is not excluded. There is no evidence of acute infarction, hemorrhage, midline shift, mass effect or extra-axial collection. Vascular: Intracranial atherosclerotic calcifications noted. Skull: Normal. Negative for fracture or focal lesion. Sinuses/Orbits: No acute finding. Opacified right sphenoid sinus again noted. Other: None IMPRESSION: Increased ventriculomegaly since 01/18/2011 - communicating hydrocephalus not excluded. No other acute abnormality identified. Atrophy and chronic small-vessel white matter ischemic changes. Electronically Signed   By: Harmon Pier M.D.   On: 12/10/2016 10:24     Microbiology: Recent Results (from the past 240 hour(s))  Urine culture     Status: Abnormal (Preliminary result)   Collection Time: 12/10/16 12:04 PM  Result Value Ref Range Status   Specimen Description URINE, CLEAN CATCH  Final   Special Requests NONE  Final   Culture (A)  Final    >=100,000 COLONIES/mL PROTEUS MIRABILIS CULTURE REINCUBATED FOR BETTER GROWTH Performed at [facility], [address], [place], [place] [address]     Report Status PENDING  Incomplete   Organism ID, Bacteria PROTEUS MIRABILIS (A)  Final      Susceptibility   Proteus mirabilis - MIC*    AMPICILLIN <=2 SENSITIVE Sensitive     CEFAZOLIN <=4 SENSITIVE Sensitive     CEFTRIAXONE <=1 SENSITIVE Sensitive     CIPROFLOXACIN <=0.25 SENSITIVE Sensitive     GENTAMICIN <=1 SENSITIVE Sensitive     IMIPENEM 2 SENSITIVE Sensitive     NITROFURANTOIN RESISTANT Resistant     TRIMETH/SULFA <=20 SENSITIVE Sensitive     AMPICILLIN/SULBACTAM <=2 SENSITIVE Sensitive     PIP/TAZO <=4 SENSITIVE Sensitive     * >=100,000 COLONIES/mL PROTEUS MIRABILIS  MRSA PCR Screening     Status: None   Collection Time: 12/10/16  5:32 PM  Result Value Ref Range Status   MRSA by PCR NEGATIVE NEGATIVE Final    Comment:        The GeneXpert MRSA Assay (FDA approved for NASAL specimens only), is one component of a comprehensive MRSA colonization surveillance program. It is not intended to diagnose MRSA infection nor to guide or monitor treatment for MRSA infections.      Labs: Basic Metabolic Panel:  Recent Labs Lab 12/10/16 1041 12/11/16 0517 12/12/16 0519 12/13/16 0548  NA 145 144 139 138  K 4.7 4.3 4.6 3.8  CL 111 113* 107 111  CO2 26 23 23 22   GLUCOSE 110* 103* 101* 99  BUN 41* 35* 25*  23*  CREATININE 1.31* 1.21 1.21 1.00  CALCIUM 10.0 9.7 9.3 9.1   Liver Function Tests:  Recent Labs Lab 12/10/16 1041  AST 24  ALT 17  ALKPHOS 62  BILITOT 1.0  PROT 6.8  ALBUMIN 3.5   No results for input(s): LIPASE, AMYLASE in the last 168 hours. No results for input(s): AMMONIA in the last 168 hours. CBC:  Recent Labs Lab 12/10/16 1041 12/11/16 0517 12/12/16 0519 12/13/16 0548  WBC 12.0* 10.3 10.0 8.8  NEUTROABS 9.3*  --   --   --   HGB 13.1 12.2* 12.2* 11.5*  HCT 40.1 37.9* 37.0* 35.0*  MCV 101.5* 100.0 100.8* 99.4  PLT 213 197 212 177   Cardiac Enzymes: No results for input(s): CKTOTAL, CKMB, CKMBINDEX, TROPONINI in the last 168  hours. BNP: Invalid input(s): POCBNP CBG: No results for input(s): GLUCAP in the last 168 hours.  Time coordinating discharge:  Greater than 30 minutes  Signed:  Earlie Schank, DO [facility] Pager: [phone] 12/13/2016, 7:25 PM

## 2016-12-13 NOTE — Clinical Social Work Note (Signed)
Clinical Social Work Assessment  Patient Details  Name: Walter Williamson MRN: [id number] Date of Birth: 05/09/1932  Date of referral:  12/13/16               Reason for consult:  Facility Placement                Permission sought to share information with:  Facility Medical sales representativeContact Representative Permission granted to share information::     [facility]   [facility]   Relationship::  Daughter   Contact Information:    [phone], [phone]    Housing/Transportation Living arrangements for the past 2 months:  Apartment Source of Information:  Patient Patient Interpreter Needed:  None Criminal Activity/Legal Involvement Pertinent to Current Situation/Hospitalization:  No - Comment as needed Significant Relationships:  Adult Children (Daughter ) Lives with:  Facility Resident Do you feel safe going back to the place where you live?  Yes Need for family participation in patient care:  Yes (Comment) (Daughter very involved)  Care giving concerns:  SNF placement before returning to ALF.    Social Worker assessment / plan:  CSW spoke with patient daughter by phone, explain role to assist with patient discharge to SNF. CSW discussed patient needed a three night qualifying stay. Patient daughter tearful after learning patient is on day two. She reports talking with ED doctor and informing him patient need be "inpatient". CSW provided emotional and listen attentively to daughter concerns about being a caretaker and wanting the best for the patient.  She reports the patient was at [facility] recently and prefers he return there. CSW contacted [facility], waiting for a return call.  Plan: Complete FL2, PASRR, start placement note, Assist with SNF placement.  Employment status:  Retired Health and safety inspectornsurance information:  Medicare PT Recommendations:   (Pending) Information / Referral to community resources:  Skilled Nursing Facility  Patient/Family's Response to care:  Patient daughter concern about 3 night stay and pt. Inability to ambulate. PT consult pending. Patient daughter appreciative of csw help and support.   Patient/Family's Understanding of and Emotional Response to Diagnosis, Current Treatment, and Prognosis:  Patient daughter express understanding the process for SNF placement, but very concern about patient status on admission. She express caretaker fatigue and need for support.   Emotional Assessment Appearance:  Developmentally appropriate, Appears stated age Attitude/Demeanor/Rapport:    Affect (typically observed):  Calm Orientation:  Oriented to Self Alcohol / Substance use:   None Psych involvement (Current and /or in the community):  No  Discharge Needs  Concerns to be addressed:  Discharge Planning Concerns Readmission within the last 30 days:  No Current discharge risk:  Dependent with Mobility Barriers to Discharge:  Continued Medical Work up, English as a second language teachernsurance Authorization (3 night qualifying stay )   Clearance CootsNicole A Mort Smelser, LCSW 12/13/2016, 9:32 AM

## 2016-12-13 NOTE — Progress Notes (Addendum)
PROGRESS NOTE  Walter RhodesJames Williamson UJW:[id number] DOB: 08/27/1931 DOA: 12/10/2016 PCP: Malka SoJobe, Daniel B., MD  Brief History: 83 year old male with a history of Alzheimer's dementia, essential hypertension, hyperlipidemia presenting with 3 day history of increasing lethargy at his assisted living facility. In addition, the patient has been noted to be more aggressive than his usual baseline when he is awake. The patient is I'm providing significant history secondary to his history of dementia. There are no reports of fevers, vomiting, headache, as per distress, diarrhea. On presentation, the patient was afebrile and hemodynamically stable and saturating on 98% on room air.WBC was 12.0 with serum creatinine at 1.31. Urinalysis showed TNTC WBC. The patient was started on intravenous fluids and ceftriaxone empirically.  Assessment/Plan: Acute metabolic encephalopathy -Multifactorial including UTI, AKI, and mild hypercalcemia -Patient's mental status has improved and daughter states he is near baseline--he remains pleasantly confused -check TSH--0.816 -check B12--562  UTI -12/10/2016 UA--TNTC WBC -proteus mirabilis--ceftriaxone started-->d/c with cefuroxime -Continue ceftriaxone pending urine culture data  AKI -Due to volume depletion -Baseline creatinine 0.7-0.8 -Serum creatinine. 1.31 at time of admission -Continue IV fluids--increase rate  Hypercalcemia -Mild  -Likely secondary to immobility -corrected calcium 10.4 -continue IVF-->improved  Alzheimer's dementia with behavioral disturbance -initial behavioral disturbance due to infectious process -with treatment of infectious process, patient's behavior has improved and patient is presently redirectable -QTc unchanged from 08/28/16 EKG when he was on aricept and namenda -Restart Aricept and namenda -daughter states patient no longer takes seroquel -daughter brought in Neudexta-->restart  Essential Hypertension -continue  home dose metoprolol  QTc prolongation -chronic and stable -08/28/16 EKG QTc = 463ms while on taking aricept  Abnormal CT brain -Noted increased ventriculomegaly compared to 01/18/2011 -Likely secondary to increased brain atrophy and setting of dementia -No additional workup at this time  Moderate malnutrition -start supplements  Disposition Plan: SNF 12/14/16 if stable Family Communication: Daughter updated 8/7--Total time spent 40 minutes.  Greater than 50% spent face to face counseling and coordinating care.    Consultants: none  Code Status: DNR  DVT Prophylaxis: [employer] Lovenox   Procedures: As Listed in Progress Note Above  Antibiotics: Ceftriaxone 8/4>>>8/7   Subjective: Patient is pleasantly confused without any aggressive behavior. He denies any headache, chest pain, shortness of breath, abdominal pain. Remainder review of systems is unobtainable secondary to patient's confusion.  Objective: Vitals:   12/12/16 1452 12/12/16 2230 12/13/16 0412 12/13/16 1400  BP: (!) 145/74 107/65 138/84 130/66  Pulse: 73 81 74 82  Resp: 20 20 18 20   Temp: (!) 97.2 F (36.2 C) 98.4 F (36.9 C) 98.5 F (36.9 C) 98.2 F (36.8 C)  TempSrc: Oral Oral Oral Oral  SpO2: 97% 93%  97%  Weight:      Height:        Intake/Output Summary (Last 24 hours) at 12/13/16 1727 Last data filed at 12/13/16 1624  Gross per 24 hour  Intake          1692.08 ml  Output             2450 ml  Net          -757.92 ml   Weight change:  Exam:   General:  Pt is alert, follows commands appropriately, not in acute distress  HEENT: No icterus, No thrush, No neck mass, [place]/AT  Cardiovascular: RRR, S1/S2, no rubs, no gallops  Respiratory: CTA bilaterally, no wheezing, no crackles, no rhonchi  Abdomen: Soft/+BS, non tender, non  distended, no guarding  Extremities: No edema, No lymphangitis, No petechiae, No rashes, no synovitis   Data Reviewed: I have personally reviewed  following labs and imaging studies Basic Metabolic Panel:  Recent Labs Lab 12/10/16 1041 12/11/16 0517 12/12/16 0519 12/13/16 0548  NA 145 144 139 138  K 4.7 4.3 4.6 3.8  CL 111 113* 107 111  CO2 26 23 23 22   GLUCOSE 110* 103* 101* 99  BUN 41* 35* 25* 23*  CREATININE 1.31* 1.21 1.21 1.00  CALCIUM 10.0 9.7 9.3 9.1   Liver Function Tests:  Recent Labs Lab 12/10/16 1041  AST 24  ALT 17  ALKPHOS 62  BILITOT 1.0  PROT 6.8  ALBUMIN 3.5   No results for input(s): LIPASE, AMYLASE in the last 168 hours. No results for input(s): AMMONIA in the last 168 hours. Coagulation Profile: No results for input(s): INR, PROTIME in the last 168 hours. CBC:  Recent Labs Lab 12/10/16 1041 12/11/16 0517 12/12/16 0519 12/13/16 0548  WBC 12.0* 10.3 10.0 8.8  NEUTROABS 9.3*  --   --   --   HGB 13.1 12.2* 12.2* 11.5*  HCT 40.1 37.9* 37.0* 35.0*  MCV 101.5* 100.0 100.8* 99.4  PLT 213 197 212 177   Cardiac Enzymes: No results for input(s): CKTOTAL, CKMB, CKMBINDEX, TROPONINI in the last 168 hours. BNP: Invalid input(s): POCBNP CBG: No results for input(s): GLUCAP in the last 168 hours. HbA1C: No results for input(s): HGBA1C in the last 72 hours. Urine analysis:    Component Value Date/Time   COLORURINE YELLOW 12/10/2016 1204   APPEARANCEUR CLOUDY (A) 12/10/2016 1204   LABSPEC 1.013 12/10/2016 1204   PHURINE 8.0 12/10/2016 1204   GLUCOSEU NEGATIVE 12/10/2016 1204   HGBUR NEGATIVE 12/10/2016 1204   BILIRUBINUR NEGATIVE 12/10/2016 1204   KETONESUR NEGATIVE 12/10/2016 1204   PROTEINUR 100 (A) 12/10/2016 1204   NITRITE NEGATIVE 12/10/2016 1204   LEUKOCYTESUR LARGE (A) 12/10/2016 1204   Sepsis Labs: @LABRCNTIP (procalcitonin:4,lacticidven:4) ) Recent Results (from the past 240 hour(s))  Urine culture     Status: Abnormal (Preliminary result)   Collection Time: 12/10/16 12:04 PM  Result Value Ref Range Status   Specimen Description URINE, CLEAN CATCH  Final   Special Requests  NONE  Final   Culture (A)  Final    >=100,000 COLONIES/mL PROTEUS MIRABILIS CULTURE REINCUBATED FOR BETTER GROWTH Performed at [facility], [address] [place], [place] [address]    Report Status PENDING  Incomplete   Organism ID, Bacteria PROTEUS MIRABILIS (A)  Final      Susceptibility   Proteus mirabilis - MIC*    AMPICILLIN <=2 SENSITIVE Sensitive     CEFAZOLIN <=4 SENSITIVE Sensitive     CEFTRIAXONE <=1 SENSITIVE Sensitive     CIPROFLOXACIN <=0.25 SENSITIVE Sensitive     GENTAMICIN <=1 SENSITIVE Sensitive     IMIPENEM 2 SENSITIVE Sensitive     NITROFURANTOIN RESISTANT Resistant     TRIMETH/SULFA <=20 SENSITIVE Sensitive     AMPICILLIN/SULBACTAM <=2 SENSITIVE Sensitive     PIP/TAZO <=4 SENSITIVE Sensitive     * >=100,000 COLONIES/mL PROTEUS MIRABILIS  MRSA PCR Screening     Status: None   Collection Time: 12/10/16  5:32 PM  Result Value Ref Range Status   MRSA by PCR NEGATIVE NEGATIVE Final    Comment:        The GeneXpert MRSA Assay (FDA approved for NASAL specimens only), is one component of a comprehensive MRSA colonization surveillance program. It is not  intended to diagnose MRSA infection nor to guide or monitor treatment for MRSA infections.      Scheduled Meds: . aspirin  81 mg Oral Daily  . [START ON 12/14/2016] cefUROXime  500 mg Oral BID WC  . Dextromethorphan-Quinidine  1 capsule Oral BID  . donepezil  10 mg Oral QHS  . enoxaparin (LOVENOX) injection  40 mg Subcutaneous Q24H  . feeding supplement (ENSURE ENLIVE)  237 mL Oral BID BM  . finasteride  5 mg Oral Daily  . memantine  10 mg Oral BID  . metoprolol tartrate  12.5 mg Oral BID  . simvastatin  40 mg Oral Daily   Continuous Infusions:  Procedures/Studies: Dg Chest 2 View  Result Date: 12/10/2016 CLINICAL DATA:  83 year old male with increase weakness and altered mental status over the last 2 days EXAM: CHEST  2 VIEW COMPARISON:  Prior chest x-ray 08/30/2016 FINDINGS: The lungs are clear  and negative for focal airspace consolidation, pulmonary edema or suspicious pulmonary nodule. Mild pulmonary vascular congestion without overt edema. No pleural effusion or pneumothorax. Cardiac and mediastinal contours are within normal limits. Trace atherosclerotic calcifications in the transverse aorta. No acute fracture or lytic or blastic osseous lesions. The visualized upper abdominal bowel gas pattern is unremarkable. IMPRESSION: 1. Minimal pulmonary vascular congestion without overt edema. 2.  Aortic Atherosclerosis (ICD10-170.0) Electronically Signed   By: Malachy Moan M.D.   On: 12/10/2016 10:57   Ct Head Wo Contrast  Result Date: 12/10/2016 CLINICAL DATA:  83 year old male with acute altered mental status 2 days ago. EXAM: CT HEAD WITHOUT CONTRAST TECHNIQUE: Contiguous axial images were obtained from the base of the skull through the vertex without intravenous contrast. COMPARISON:  01/18/2011 MR FINDINGS: Brain: Atrophy and chronic small-vessel white matter ischemic changes again noted. Ventriculomegaly has increased since 01/18/2011 - communicating hydrocephalus is not excluded. There is no evidence of acute infarction, hemorrhage, midline shift, mass effect or extra-axial collection. Vascular: Intracranial atherosclerotic calcifications noted. Skull: Normal. Negative for fracture or focal lesion. Sinuses/Orbits: No acute finding. Opacified right sphenoid sinus again noted. Other: None IMPRESSION: Increased ventriculomegaly since 01/18/2011 - communicating hydrocephalus not excluded. No other acute abnormality identified. Atrophy and chronic small-vessel white matter ischemic changes. Electronically Signed   By: Harmon Pier M.D.   On: 12/10/2016 10:24    Mahalia Dykes, DO  [facility] Pager [phone]  If 7PM-7AM, please contact night-coverage [email or web address] Password [email or web address] 12/13/2016, 5:27 PM   LOS: 2 days

## 2016-12-13 NOTE — Evaluation (Signed)
Physical Therapy Evaluation Patient Details Name: Walter Williamson MRN: [id number] DOB: 06-02-1931 Today's Date: 12/13/2016   History of Present Illness  83 yo male admitted with metabolic encephalopathy, UTI. Hx of Alz dementia.   Clinical Impression  On eval, pt required Max assist for mobility. He sat EOB at least 5 minutes with Min assist for static sitting balance. Sit to stand x 2 from elevated bed with RW. Heavy posterior lean. Pt is at high risk for falls. No family present during session. Recommend SNF unless ALF feels they can provide current level of care. Will follow and progress activity as able.     Follow Up Recommendations SNF    Equipment Recommendations  None recommended by PT    Recommendations for Other Services       Precautions / Restrictions Precautions Precautions: Fall Precaution Comments: incontinent Restrictions Weight Bearing Restrictions: No      Mobility  Bed Mobility Overal bed mobility: Needs Assistance Bed Mobility: Supine to Sit;Sit to Supine     Supine to sit: Max assist;HOB elevated Sit to supine: Max assist;HOB elevated   General bed mobility comments: Assist for trunk and bil LEs. Utilized bedpad for scooting, positioning. Increased time. Multimodal cueing.   Transfers Overall transfer level: Needs assistance Equipment used: Rolling walker (2 wheeled) Transfers: Sit to/from Stand Sit to Stand: Max assist;From elevated surface         General transfer comment: x 2. Assist to rise, stabilize, control descent. Pt stood for ~10-15 seconds each attempt. Heavy posterior lean.   Ambulation/Gait             General Gait Details: Unable to safely attempt with +1 assist.   Stairs            Wheelchair Mobility    Modified Rankin (Stroke Patients Only)       Balance Overall balance assessment: Needs assistance Sitting-balance support: Bilateral upper extremity supported Sitting balance-Leahy Scale: Poor     Standing  balance support: Bilateral upper extremity supported Standing balance-Leahy Scale: Poor                               Pertinent Vitals/Pain Pain Assessment: No/denies pain    Home Living Family/patient expects to be discharged to:: Skilled nursing facility                 Additional Comments: Pt from memory care facility    Prior Function Level of Independence: Needs assistance               Hand Dominance        Extremity/Trunk Assessment   Upper Extremity Assessment Upper Extremity Assessment: Difficult to assess due to impaired cognition    Lower Extremity Assessment Lower Extremity Assessment: Difficult to assess due to impaired cognition    Cervical / Trunk Assessment Cervical / Trunk Assessment: Kyphotic  Communication   Communication: HOH  Cognition Arousal/Alertness: Awake/alert Behavior During Therapy: WFL for tasks assessed/performed Overall Cognitive Status: History of cognitive impairments - at baseline                                        General Comments      Exercises     Assessment/Plan    PT Assessment Patient needs continued PT services  PT Problem List Decreased strength;Decreased mobility;Decreased balance;Decreased knowledge of use  of DME;Decreased cognition       PT Treatment Interventions DME instruction;Gait training;Therapeutic activities;Therapeutic exercise;Patient/family education;Balance training;Functional mobility training    PT Goals (Current goals can be found in the Care Plan section)  Acute Rehab PT Goals Patient Stated Goal: pt unable to state PT Goal Formulation: Patient unable to participate in goal setting Time For Goal Achievement: 12/27/16 Potential to Achieve Goals: Fair    Frequency Min 2X/week   Barriers to discharge        Co-evaluation               AM-PAC PT "6 Clicks" Daily Activity  Outcome Measure Difficulty turning over in bed (including adjusting  bedclothes, sheets and blankets)?: Total Difficulty moving from lying on back to sitting on the side of the bed? : Total Difficulty sitting down on and standing up from a chair with arms (e.g., wheelchair, bedside commode, etc,.)?: Total Help needed moving to and from a bed to chair (including a wheelchair)?: Total Help needed walking in hospital room?: Total Help needed climbing 3-5 steps with a railing? : Total 6 Click Score: 6    End of Session Equipment Utilized During Treatment: Gait belt Activity Tolerance: Patient tolerated treatment well Patient left: in bed;with call bell/phone within reach;with bed alarm set   PT Visit Diagnosis: Muscle weakness (generalized) (M62.81);Difficulty in walking, not elsewhere classified (R26.2)    Time: 1015-1030 PT Time Calculation (min) (ACUTE ONLY): 15 min   Charges:   PT Evaluation $PT Eval Moderate Complexity: 1 Mod     PT G Codes:          Walter Williamson, MPT Pager: [phone]

## 2016-12-14 LAB — URINE CULTURE: Culture: >=100000 — AB

## 2016-12-14 NOTE — Progress Notes (Signed)
Mittens removed. Patient resting.

## 2016-12-14 NOTE — Clinical Social Work Placement (Signed)
   CLINICAL SOCIAL WORK PLACEMENT  NOTE  Date:  12/14/2016  Patient Details  Name: Walter Williamson MRN: [id number] Date of Birth: [birth year]  Clinical Social Work is seeking post-discharge placement for this patient at the Skilled  Nursing Facility level of care (*CSW will initial, date and re-position this form in  chart as items are completed):  Yes   Patient/family provided with [facility] Clinical Social Work Department's list of facilities offering this level of care within the geographic area requested by the patient (or if unable, by the patient's family).  Yes   Patient/family informed of their freedom to choose among providers that offer the needed level of care, that participate in Medicare, Medicaid or managed care program needed by the patient, have an available bed and are willing to accept the patient.  Yes   Patient/family informed of [facility] ownership interest in [facility] and [facility], as well as of the fact that they are under no obligation to receive care at these facilities.  PASRR submitted to EDS on       PASRR number received on       Existing PASRR number confirmed on 12/14/16     FL2 transmitted to all facilities in geographic area requested by pt/family on       FL2 transmitted to all facilities within larger geographic area on       Patient informed that his/her managed care company has contracts with or will negotiate with certain facilities, including the following:  [facility]     No   Patient/family informed of bed offers received.  [facility]     Physician recommends and patient [facility]    [facility].  Patient to be transferred to facility by [facility]     Patient family notified on 12/14/16 of transfer.  Name of family member notified:  Daughter     PHYSICIAN Please sign DNR, Please prepare prescriptions     Additional  Comment:    _______________________________________________ Clearance CootsNicole A Valda Christenson, LCSW 12/14/2016, 2:16 PM

## 2016-12-14 NOTE — Progress Notes (Signed)
Patient seen and examined this morning.  Refer to discharge summary by Dr. Arbutus Leasat on 12/13/2016.  Continues to remain stable for discharge however had mittens overnight which are considered restraints preventing SNF discharge. Discussed with Child psychotherapistsocial worker.   Chantay Whitelock M. Elvera LennoxGherghe, MD [facility] [phone]   If 7PM-7AM, please contact night-coverage [email or web address] Password [email or web address]

## 2016-12-14 NOTE — Discharge Planning (Addendum)
9:38 AM CSW spoke with patient daughter at length about her concerns with clinical documentation indicating patient has medical history of agreesion. Patient daughter is not happy with SNF options at this time.She reports she spoke SNF's yesterday about opening and requested CSW follow up. [facility] at this time does not have bed availability and waiting for [facility] return call.  CSW inform patient daughter of pt. Wearing mittens overnight and some facilities considering them as a form of restraint. Patient will have to be without mittens for 24 hours.  CSW will continue to assist with placement.

## 2017-02-06 DEATH — deceased

## 2018-04-28 IMAGING — DX DG CHEST 2V
2 series · 2 of 2 positions shown · non-contrast
Comparison: None.

CLINICAL DATA: Possible sepsis, fever with hypotension

EXAM:
CHEST  2 VIEW

[chest lat]
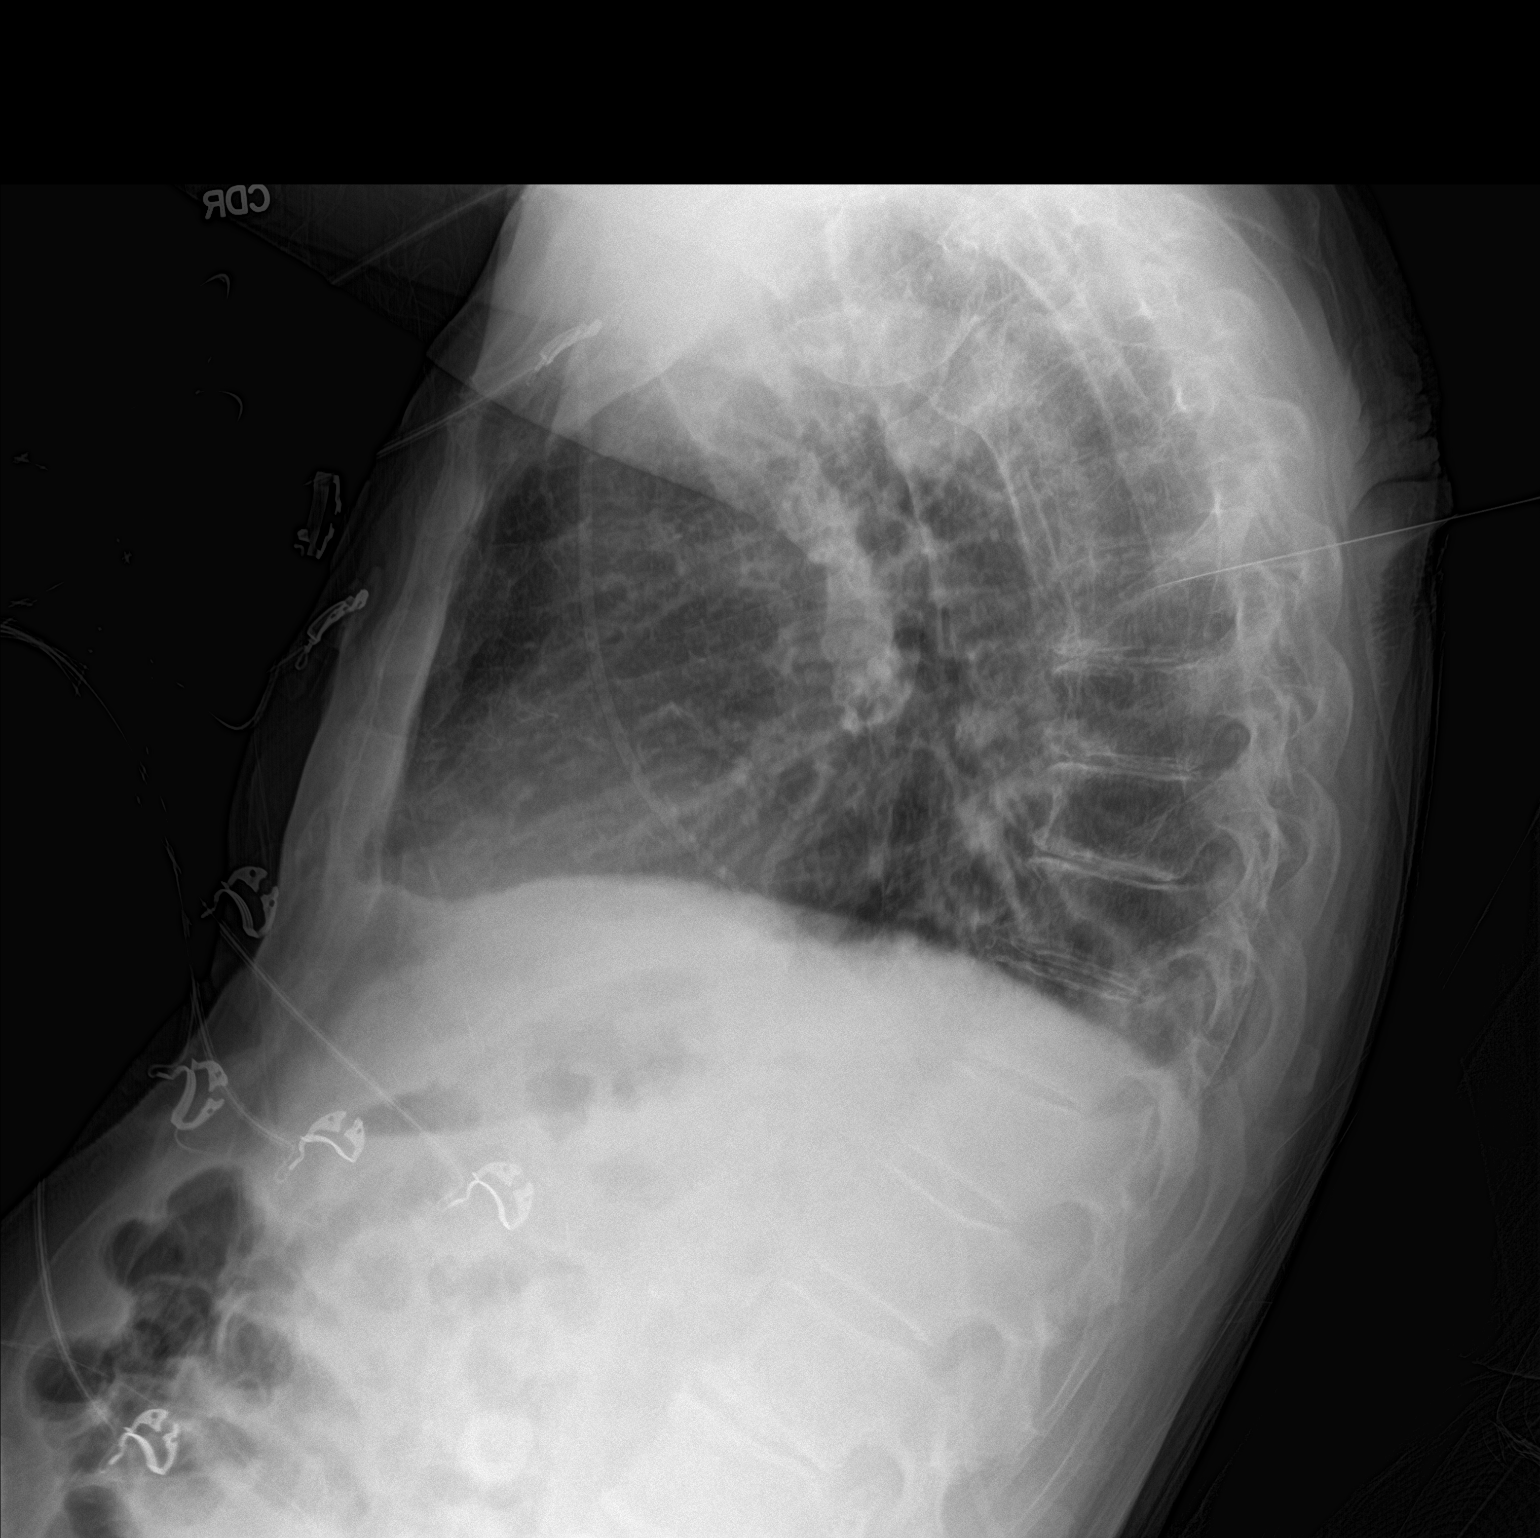

[chest ap strecther]
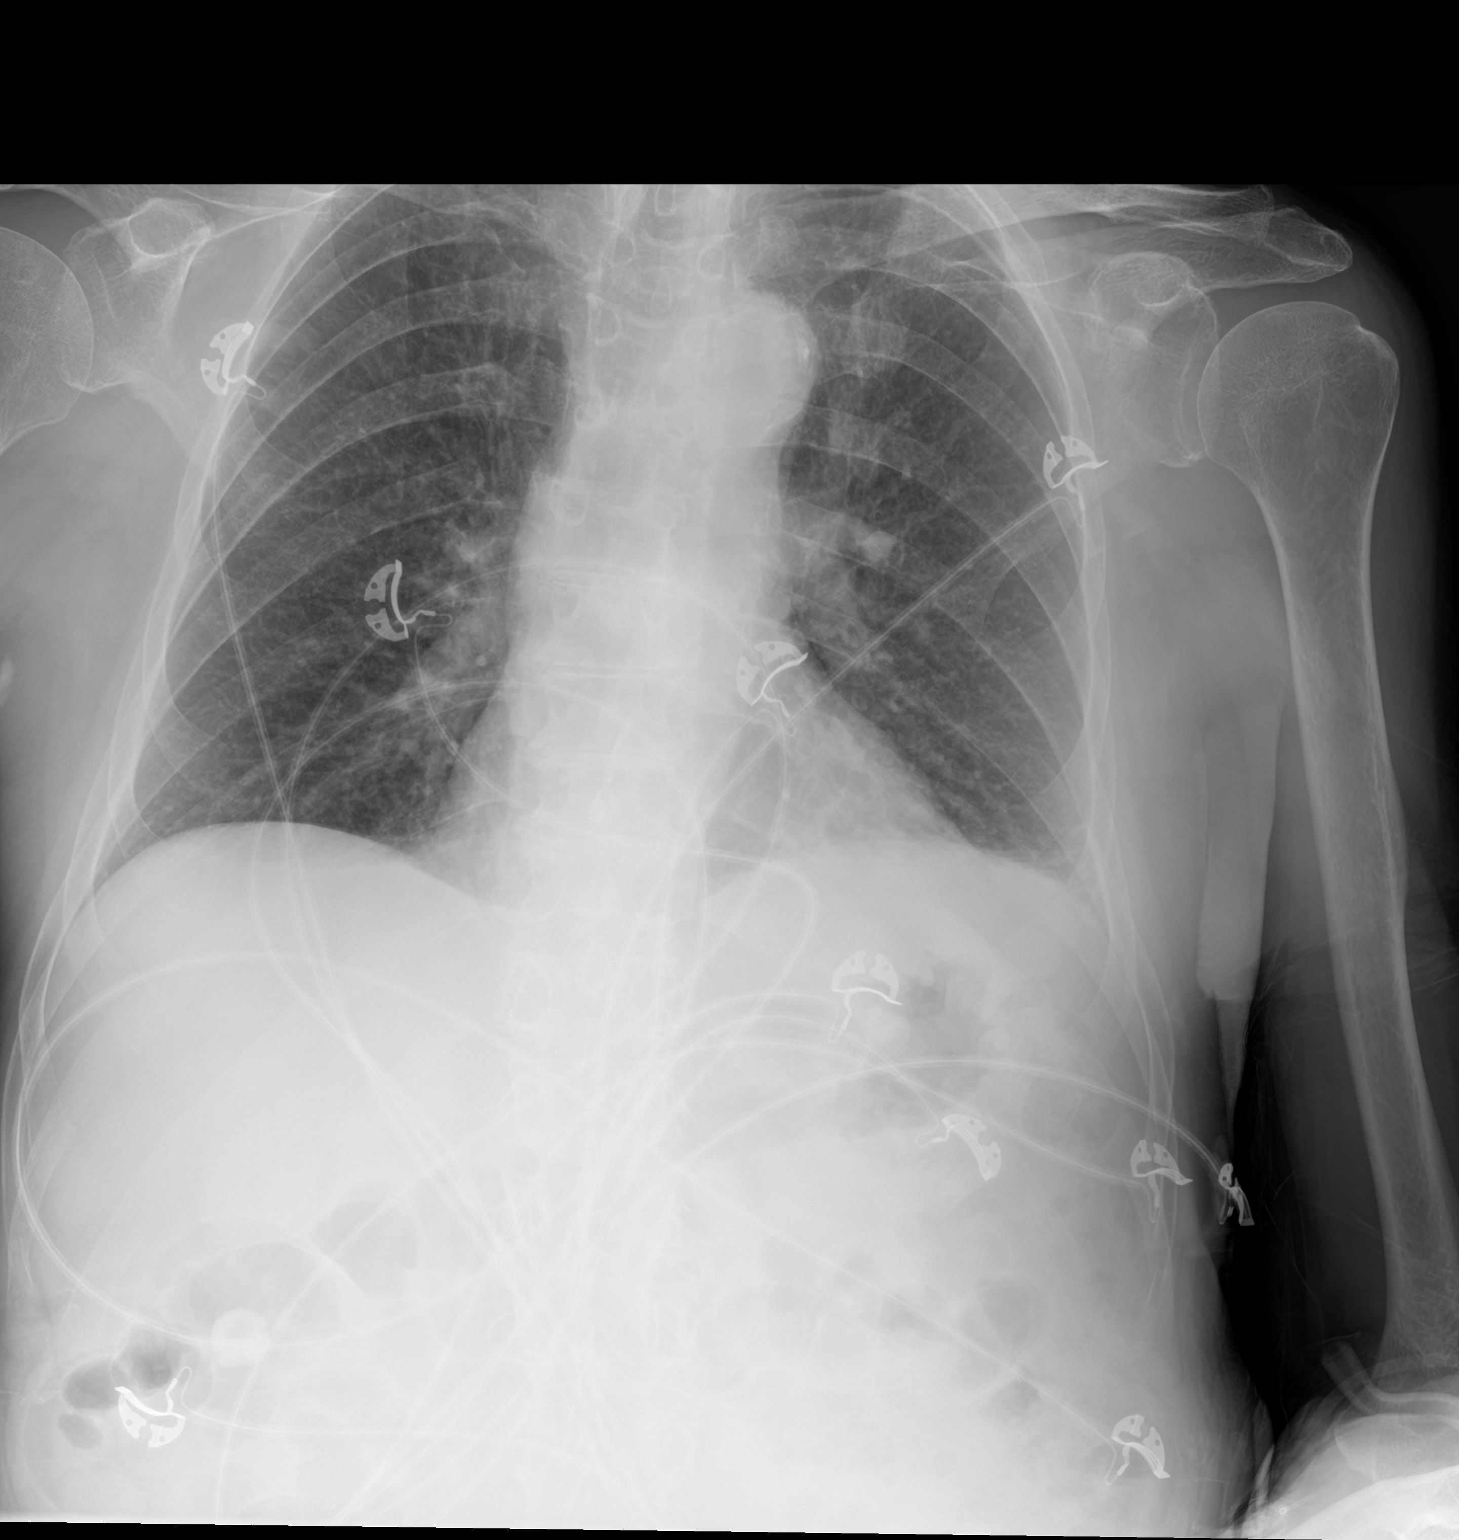

[2 of 2 positions shown; findings below may reference images not displayed]

FINDINGS: Minimal atelectasis at the left lung base. No focal consolidation or
large pleural effusion. Heart size upper normal. Aortic
atherosclerosis. No pneumothorax. Mild degenerative changes of the
spine.
IMPRESSION: Minimal atelectasis at the left lung base.  No focal consolidation.

## 2018-04-30 IMAGING — CR DG CHEST 1V PORT
1 series · 1 of 1 positions shown · non-contrast
Comparison: 08/28/2016

CLINICAL DATA: Shortness of breath

EXAM:
PORTABLE CHEST 1 VIEW

[portable]
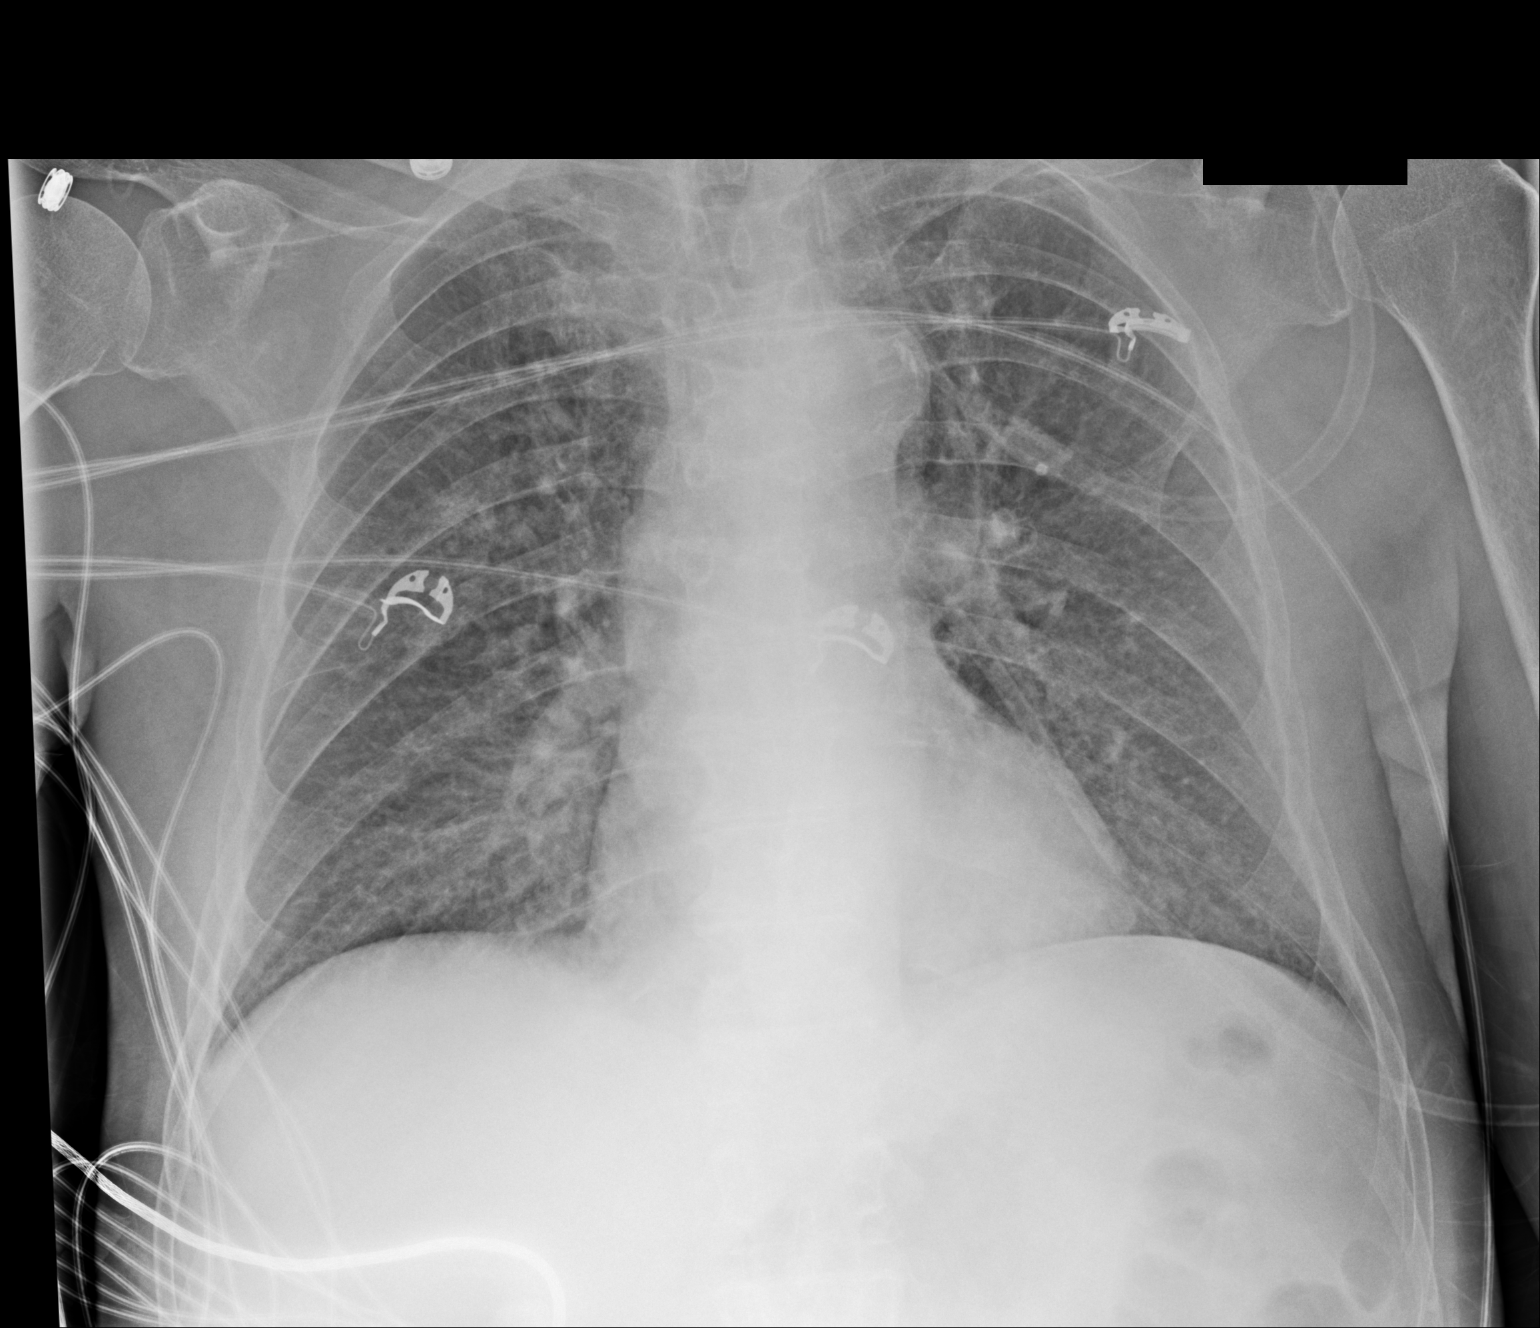

[1 of 1 positions shown; findings below may reference images not displayed]

FINDINGS: New generalized interstitial coarsening, suspected edema. No
effusions or pneumothorax. No asymmetric density or air bronchogram.
Normal heart size and mediastinal contours.
IMPRESSION: New mild pulmonary edema.

## 2018-08-10 IMAGING — CR DG CHEST 2V
3 series · 3 of 3 positions shown · non-contrast
Comparison: Prior chest x-ray 08/30/2016

CLINICAL DATA: 85-year-old male with increase weakness and altered
mental status over the last 2 days

EXAM:
CHEST  2 VIEW

[x chest ap]
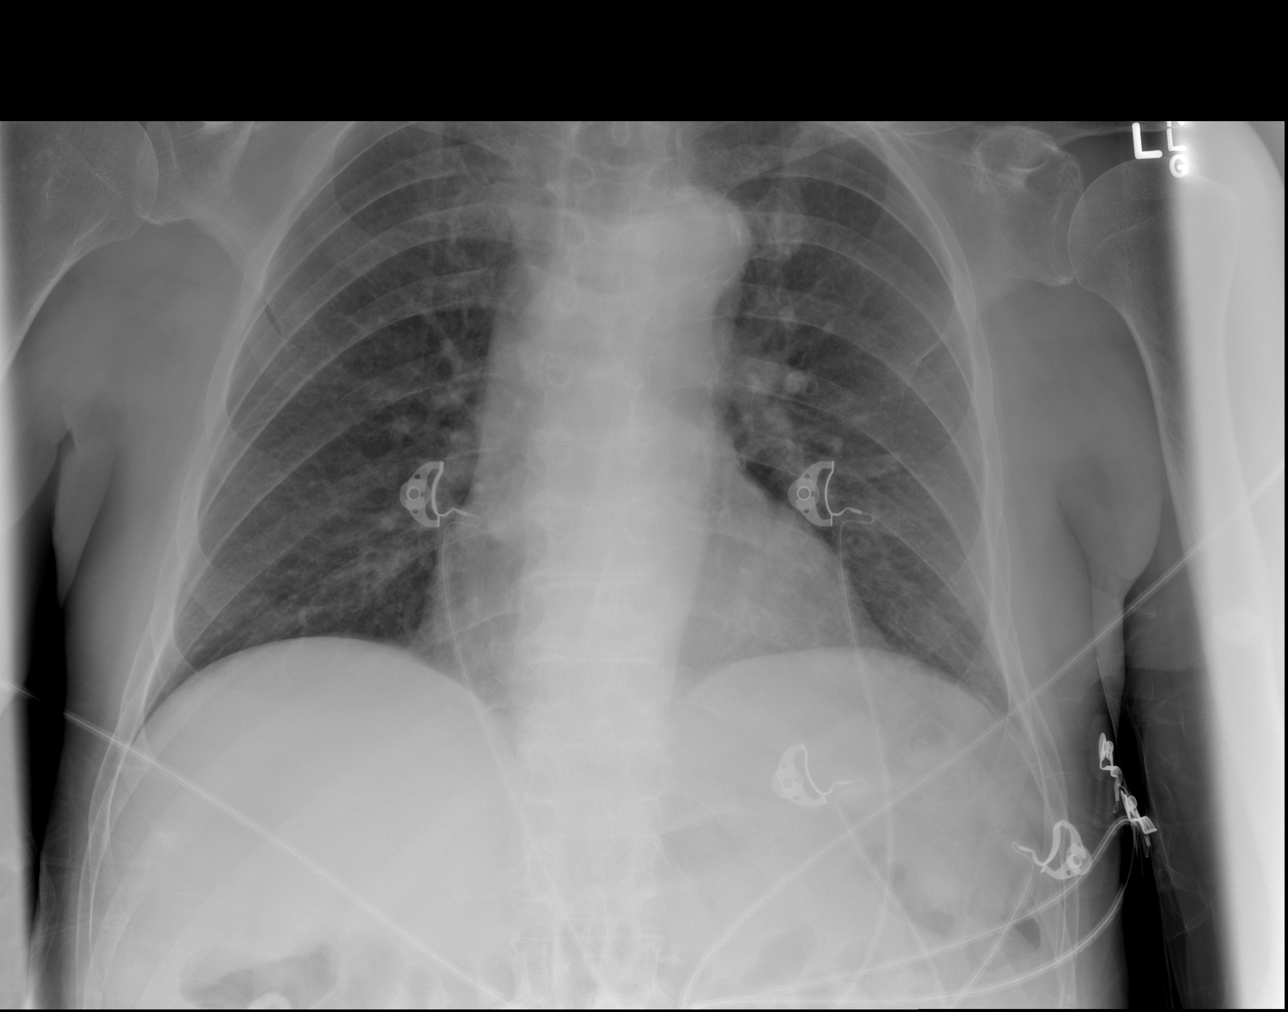

[w chest lat (1 of 2)]
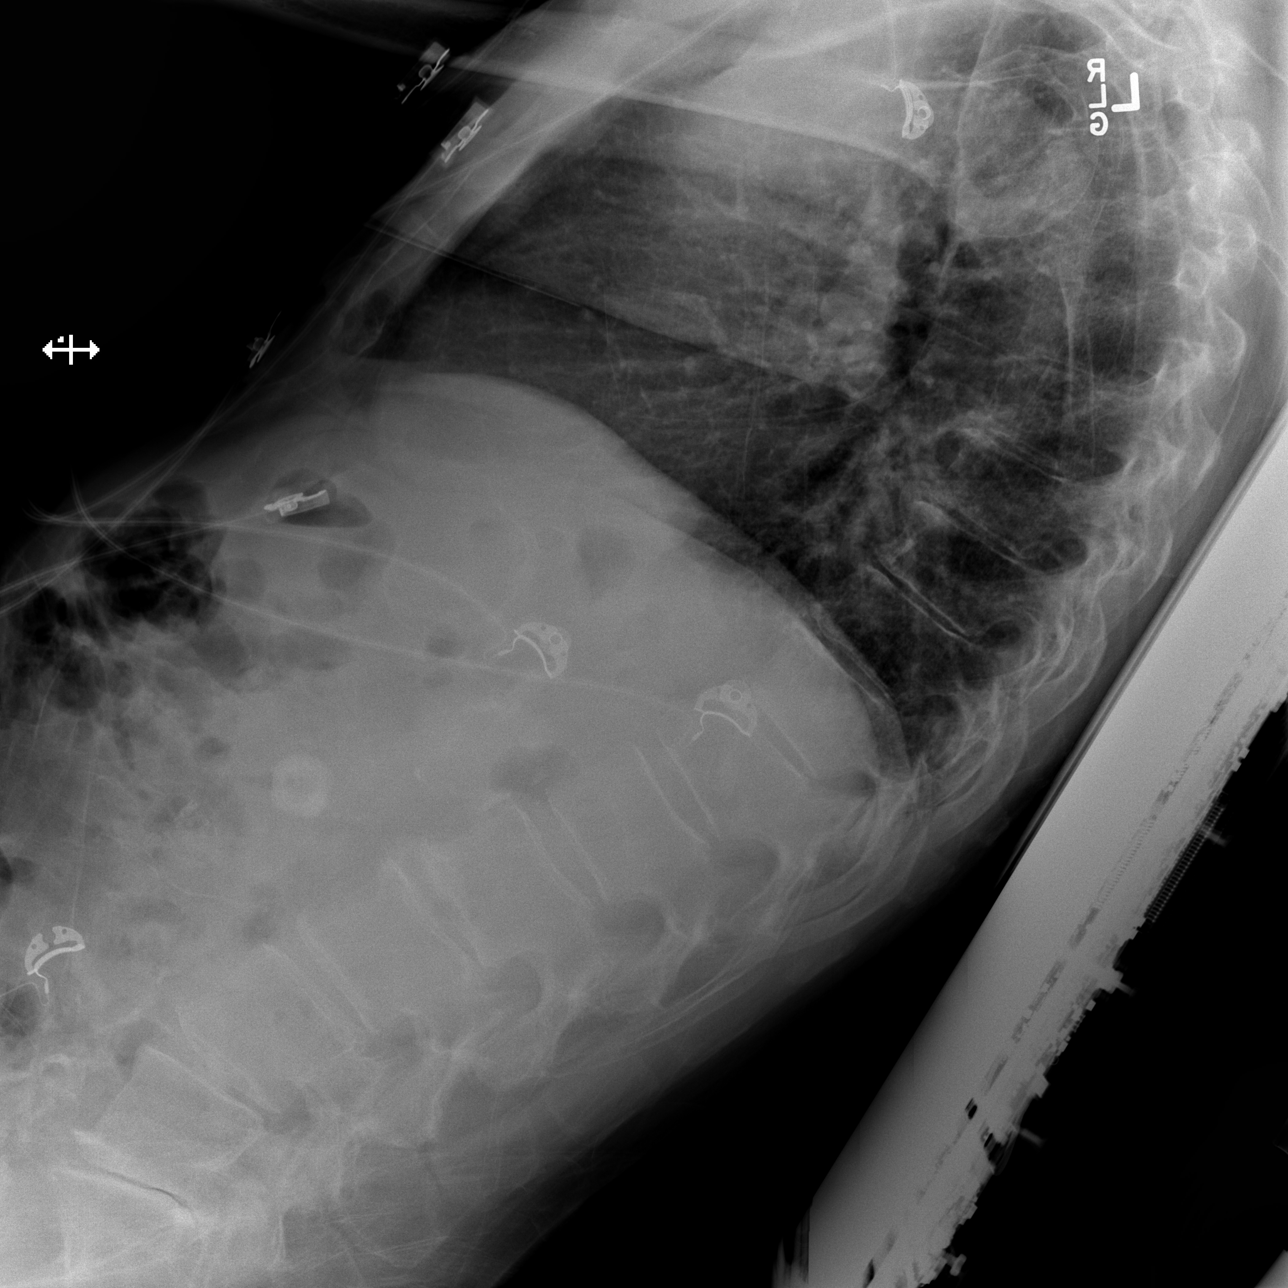

[w chest lat (2 of 2)]
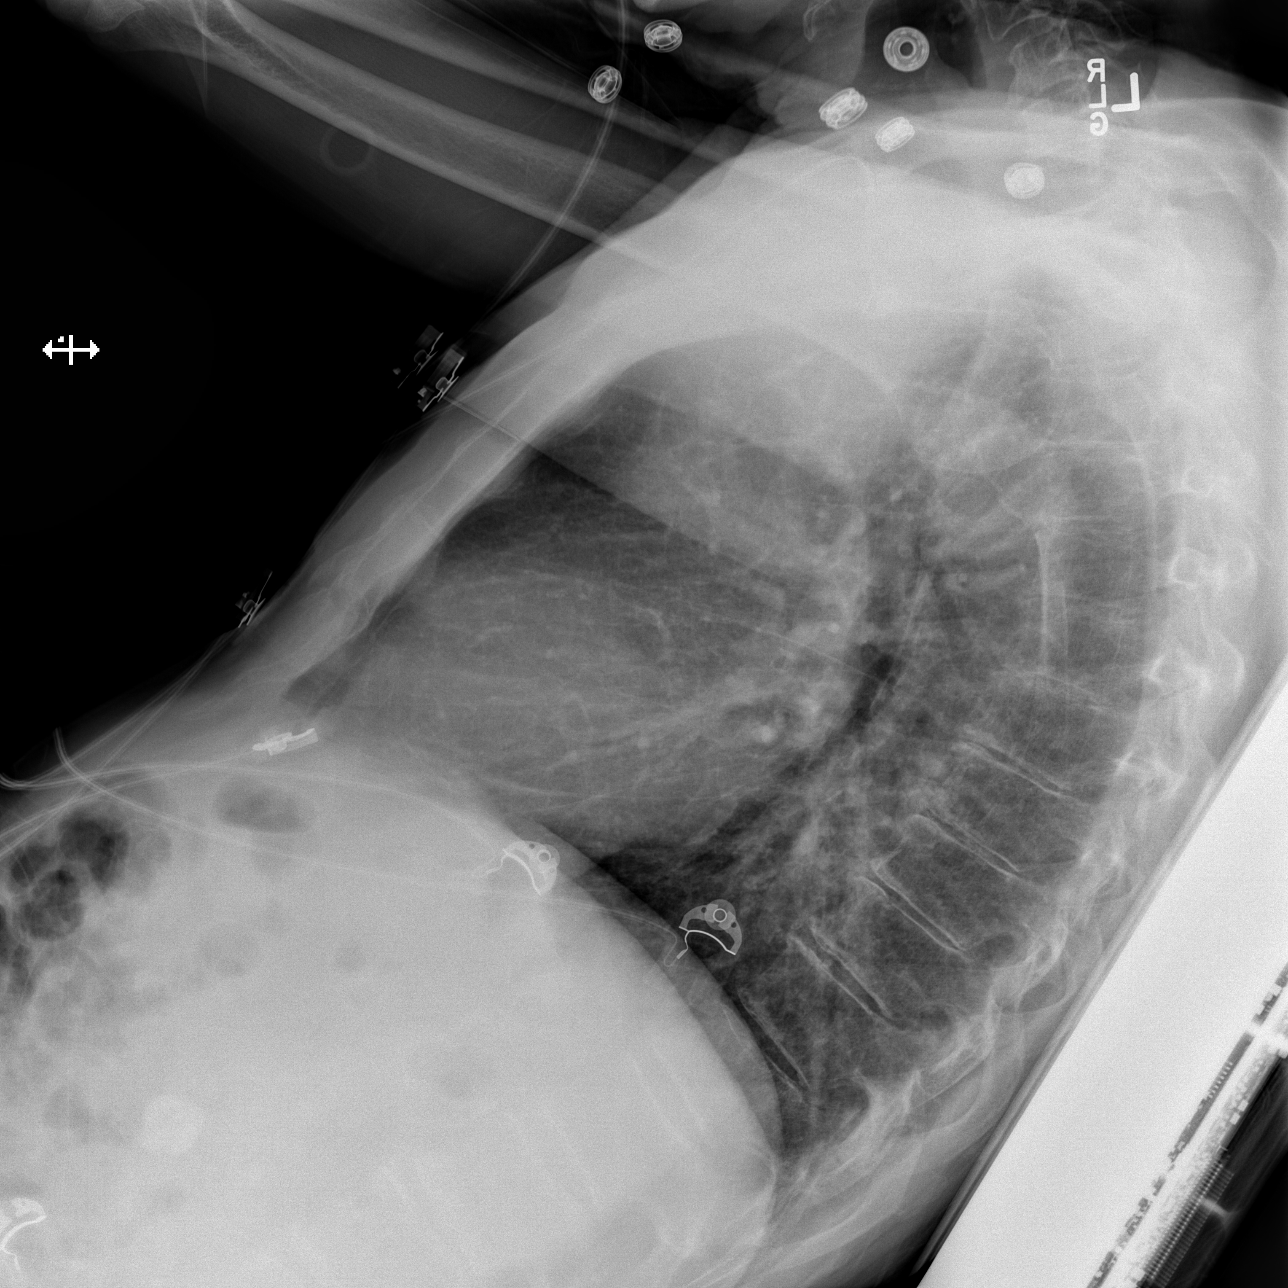

[3 of 3 positions shown; findings below may reference images not displayed]

FINDINGS: The lungs are clear and negative for focal airspace consolidation,
pulmonary edema or suspicious pulmonary nodule. Mild pulmonary
vascular congestion without overt edema. No pleural effusion or
pneumothorax. Cardiac and mediastinal contours are within normal
limits. Trace atherosclerotic calcifications in the transverse
aorta. No acute fracture or lytic or blastic osseous lesions. The
visualized upper abdominal bowel gas pattern is unremarkable.
IMPRESSION: 1. Minimal pulmonary vascular congestion without overt edema.
2.  Aortic Atherosclerosis ([id number]-170.0)
